# Patient Record
Sex: Female | Born: 2000 | Race: Black or African American | Hispanic: No | Marital: Single | Smoking: Never smoker
Health system: Southern US, Community
[De-identification: ages and names within clinical notes are randomized; demographics above are authoritative.]

## PROBLEM LIST (undated history)

## (undated) DIAGNOSIS — E282 Polycystic ovarian syndrome: Secondary | ICD-10-CM

## (undated) DIAGNOSIS — D1800 Hemangioma unspecified site: Secondary | ICD-10-CM

## (undated) DIAGNOSIS — G5600 Carpal tunnel syndrome, unspecified upper limb: Secondary | ICD-10-CM

## (undated) DIAGNOSIS — L83 Acanthosis nigricans: Secondary | ICD-10-CM

## (undated) DIAGNOSIS — Q170 Accessory auricle: Secondary | ICD-10-CM

## (undated) DIAGNOSIS — N390 Urinary tract infection, site not specified: Secondary | ICD-10-CM

## (undated) HISTORY — PX: MYRINGOTOMY: SUR874

## (undated) HISTORY — DX: Carpal tunnel syndrome, unspecified upper limb: G56.00

## (undated) HISTORY — DX: Urinary tract infection, site not specified: N39.0

## (undated) HISTORY — DX: Accessory auricle: Q17.0

## (undated) HISTORY — PX: WISDOM TOOTH EXTRACTION: SHX21

---
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
## 2018-01-03 DIAGNOSIS — M9903 Segmental and somatic dysfunction of lumbar region: Secondary | ICD-10-CM | POA: Diagnosis not present

## 2018-01-03 DIAGNOSIS — M9905 Segmental and somatic dysfunction of pelvic region: Secondary | ICD-10-CM | POA: Diagnosis not present

## 2018-01-03 DIAGNOSIS — M5386 Other specified dorsopathies, lumbar region: Secondary | ICD-10-CM | POA: Diagnosis not present

## 2018-01-17 DIAGNOSIS — M9905 Segmental and somatic dysfunction of pelvic region: Secondary | ICD-10-CM | POA: Diagnosis not present

## 2018-01-17 DIAGNOSIS — M9903 Segmental and somatic dysfunction of lumbar region: Secondary | ICD-10-CM | POA: Diagnosis not present

## 2018-01-17 DIAGNOSIS — M5386 Other specified dorsopathies, lumbar region: Secondary | ICD-10-CM | POA: Diagnosis not present

## 2018-02-07 DIAGNOSIS — L83 Acanthosis nigricans: Secondary | ICD-10-CM | POA: Diagnosis not present

## 2018-02-07 DIAGNOSIS — L700 Acne vulgaris: Secondary | ICD-10-CM | POA: Diagnosis not present

## 2018-02-14 DIAGNOSIS — M9903 Segmental and somatic dysfunction of lumbar region: Secondary | ICD-10-CM | POA: Diagnosis not present

## 2018-02-14 DIAGNOSIS — M9905 Segmental and somatic dysfunction of pelvic region: Secondary | ICD-10-CM | POA: Diagnosis not present

## 2018-02-14 DIAGNOSIS — M5386 Other specified dorsopathies, lumbar region: Secondary | ICD-10-CM | POA: Diagnosis not present

## 2018-02-23 DIAGNOSIS — S93504A Unspecified sprain of right lesser toe(s), initial encounter: Secondary | ICD-10-CM | POA: Diagnosis not present

## 2018-02-23 DIAGNOSIS — M71379 Other bursal cyst, unspecified ankle and foot: Secondary | ICD-10-CM | POA: Diagnosis not present

## 2018-02-26 DIAGNOSIS — R5383 Other fatigue: Secondary | ICD-10-CM | POA: Diagnosis not present

## 2018-03-09 DIAGNOSIS — H938X1 Other specified disorders of right ear: Secondary | ICD-10-CM | POA: Diagnosis not present

## 2018-03-09 DIAGNOSIS — H6122 Impacted cerumen, left ear: Secondary | ICD-10-CM | POA: Diagnosis not present

## 2018-03-19 DIAGNOSIS — M5386 Other specified dorsopathies, lumbar region: Secondary | ICD-10-CM | POA: Diagnosis not present

## 2018-03-19 DIAGNOSIS — M9903 Segmental and somatic dysfunction of lumbar region: Secondary | ICD-10-CM | POA: Diagnosis not present

## 2018-03-19 DIAGNOSIS — M9905 Segmental and somatic dysfunction of pelvic region: Secondary | ICD-10-CM | POA: Diagnosis not present

## 2018-04-09 DIAGNOSIS — L700 Acne vulgaris: Secondary | ICD-10-CM | POA: Diagnosis not present

## 2018-04-30 DIAGNOSIS — M9905 Segmental and somatic dysfunction of pelvic region: Secondary | ICD-10-CM | POA: Diagnosis not present

## 2018-04-30 DIAGNOSIS — M5386 Other specified dorsopathies, lumbar region: Secondary | ICD-10-CM | POA: Diagnosis not present

## 2018-04-30 DIAGNOSIS — M9903 Segmental and somatic dysfunction of lumbar region: Secondary | ICD-10-CM | POA: Diagnosis not present

## 2018-05-28 DIAGNOSIS — H6122 Impacted cerumen, left ear: Secondary | ICD-10-CM | POA: Diagnosis not present

## 2018-06-06 DIAGNOSIS — J069 Acute upper respiratory infection, unspecified: Secondary | ICD-10-CM | POA: Diagnosis not present

## 2018-06-21 DIAGNOSIS — N6320 Unspecified lump in the left breast, unspecified quadrant: Secondary | ICD-10-CM | POA: Diagnosis not present

## 2018-06-25 ENCOUNTER — Other Ambulatory Visit: Payer: Self-pay | Admitting: Obstetrics and Gynecology

## 2018-06-25 DIAGNOSIS — N6320 Unspecified lump in the left breast, unspecified quadrant: Secondary | ICD-10-CM

## 2018-07-09 DIAGNOSIS — L700 Acne vulgaris: Secondary | ICD-10-CM | POA: Diagnosis not present

## 2018-07-09 DIAGNOSIS — L83 Acanthosis nigricans: Secondary | ICD-10-CM | POA: Diagnosis not present

## 2018-07-10 ENCOUNTER — Ambulatory Visit
Admission: RE | Admit: 2018-07-10 | Discharge: 2018-07-10 | Disposition: A | Discharge status: Home / Self Care | Payer: 59 | Source: Ambulatory Visit | Attending: Obstetrics and Gynecology | Admitting: Obstetrics and Gynecology

## 2018-07-10 DIAGNOSIS — N6489 Other specified disorders of breast: Secondary | ICD-10-CM | POA: Diagnosis not present

## 2018-07-10 DIAGNOSIS — N6320 Unspecified lump in the left breast, unspecified quadrant: Secondary | ICD-10-CM

## 2018-08-15 DIAGNOSIS — M9901 Segmental and somatic dysfunction of cervical region: Secondary | ICD-10-CM | POA: Diagnosis not present

## 2018-08-15 DIAGNOSIS — M531 Cervicobrachial syndrome: Secondary | ICD-10-CM | POA: Diagnosis not present

## 2018-08-15 DIAGNOSIS — M9902 Segmental and somatic dysfunction of thoracic region: Secondary | ICD-10-CM | POA: Diagnosis not present

## 2018-08-20 DIAGNOSIS — M9902 Segmental and somatic dysfunction of thoracic region: Secondary | ICD-10-CM | POA: Diagnosis not present

## 2018-08-20 DIAGNOSIS — M9901 Segmental and somatic dysfunction of cervical region: Secondary | ICD-10-CM | POA: Diagnosis not present

## 2018-08-20 DIAGNOSIS — M531 Cervicobrachial syndrome: Secondary | ICD-10-CM | POA: Diagnosis not present

## 2018-08-29 DIAGNOSIS — M9901 Segmental and somatic dysfunction of cervical region: Secondary | ICD-10-CM | POA: Diagnosis not present

## 2018-08-29 DIAGNOSIS — M531 Cervicobrachial syndrome: Secondary | ICD-10-CM | POA: Diagnosis not present

## 2018-08-29 DIAGNOSIS — M9902 Segmental and somatic dysfunction of thoracic region: Secondary | ICD-10-CM | POA: Diagnosis not present

## 2018-09-12 DIAGNOSIS — M180 Bilateral primary osteoarthritis of first carpometacarpal joints: Secondary | ICD-10-CM | POA: Diagnosis not present

## 2018-09-12 DIAGNOSIS — L700 Acne vulgaris: Secondary | ICD-10-CM | POA: Diagnosis not present

## 2018-09-12 DIAGNOSIS — L110 Acquired keratosis follicularis: Secondary | ICD-10-CM | POA: Diagnosis not present

## 2018-10-24 DIAGNOSIS — R5383 Other fatigue: Secondary | ICD-10-CM | POA: Diagnosis not present

## 2018-11-05 ENCOUNTER — Encounter (HOSPITAL_BASED_OUTPATIENT_CLINIC_OR_DEPARTMENT_OTHER): Payer: Self-pay

## 2018-11-05 ENCOUNTER — Other Ambulatory Visit: Payer: Self-pay

## 2018-11-05 ENCOUNTER — Emergency Department (HOSPITAL_BASED_OUTPATIENT_CLINIC_OR_DEPARTMENT_OTHER)
Admission: EM | Admit: 2018-11-05 | Discharge: 2018-11-05 | Disposition: A | Discharge status: Home / Self Care | Payer: 59 | Attending: Emergency Medicine | Admitting: Emergency Medicine

## 2018-11-05 ENCOUNTER — Emergency Department (HOSPITAL_BASED_OUTPATIENT_CLINIC_OR_DEPARTMENT_OTHER): Payer: 59

## 2018-11-05 DIAGNOSIS — R202 Paresthesia of skin: Secondary | ICD-10-CM | POA: Insufficient documentation

## 2018-11-05 DIAGNOSIS — R200 Anesthesia of skin: Secondary | ICD-10-CM

## 2018-11-05 DIAGNOSIS — R29810 Facial weakness: Secondary | ICD-10-CM | POA: Diagnosis not present

## 2018-11-05 DIAGNOSIS — R531 Weakness: Secondary | ICD-10-CM | POA: Diagnosis not present

## 2018-11-05 HISTORY — DX: Acanthosis nigricans: L83

## 2018-11-05 HISTORY — DX: Polycystic ovarian syndrome: E28.2

## 2018-11-05 LAB — CBC WITH DIFFERENTIAL/PLATELET
Abs Immature Granulocytes: 0.01 10*3/uL (ref 0.00–0.07)
Basophils Absolute: 0 10*3/uL (ref 0.0–0.1)
Basophils Relative: 1 %
Eosinophils Absolute: 0.2 10*3/uL (ref 0.0–0.5)
Eosinophils Relative: 4 %
HCT: 39.7 % (ref 36.0–46.0)
Hemoglobin: 13.1 g/dL (ref 12.0–15.0)
Immature Granulocytes: 0 %
Lymphocytes Relative: 42 %
Lymphs Abs: 2.6 10*3/uL (ref 0.7–4.0)
MCH: 30.3 pg (ref 26.0–34.0)
MCHC: 33 g/dL (ref 30.0–36.0)
MCV: 91.9 fL (ref 80.0–100.0)
Monocytes Absolute: 0.5 10*3/uL (ref 0.1–1.0)
Monocytes Relative: 7 %
Neutro Abs: 3 10*3/uL (ref 1.7–7.7)
Neutrophils Relative %: 46 %
Platelets: 285 10*3/uL (ref 150–400)
RBC: 4.32 MIL/uL (ref 3.87–5.11)
RDW: 12.8 % (ref 11.5–15.5)
WBC: 6.4 10*3/uL (ref 4.0–10.5)
nRBC: 0 % (ref 0.0–0.2)

## 2018-11-05 LAB — BASIC METABOLIC PANEL
Anion gap: 6 (ref 5–15)
BUN: 12 mg/dL (ref 6–20)
CO2: 25 mmol/L (ref 22–32)
Calcium: 9.2 mg/dL (ref 8.9–10.3)
Chloride: 106 mmol/L (ref 98–111)
Creatinine, Ser: 0.8 mg/dL (ref 0.44–1.00)
GFR calc Af Amer: >60 mL/min (ref >60)
GFR calc non Af Amer: >60 mL/min (ref >60)
Glucose, Bld: 79 mg/dL (ref 70–99)
Potassium: 3.8 mmol/L (ref 3.5–5.1)
Sodium: 137 mmol/L (ref 135–145)

## 2018-11-05 NOTE — Discharge Instructions (Addendum)
You have been seen today for facial pain, numbness and arm pain. please read and follow all provided instructions. Return to the emergency room for worsening condition or new concerning symptoms.    Your work-up in the emergency department today was overall negative.  Your head CT is normal, her EKG and blood work was also normal.  1. Medications:  Prescription you can take Tylenol or ibuprofen for pain.  Please take as directed on the bottle. Continue usual home medications Take medications as prescribed. Please review all of the medicines and only take them if you do not have an allergy to them.  2. Treatment: rest, drink plenty of fluids 3. Follow Up: Please follow up with your primary doctor in 2-5 days for discussion of your diagnoses and further evaluation after today's visit; Call today to arrange your follow up.  Possible your primary care doctor will order an outpatient MRI or have you follow-up with a neurologist.  It is also a possibility that you have an allergic reaction to any of the medicines that you have been prescribed - Everybody reacts differently to medications and while MOST people have no trouble with most medicines, you may have a reaction such as nausea, vomiting, rash, swelling, shortness of breath. If this is the case, please stop taking the medicine immediately and contact your physician.  ?

## 2018-11-05 NOTE — ED Provider Notes (Signed)
[place] EMERGENCY DEPARTMENT Provider Note   CSN: [id number] Arrival date & time: 11/05/18  1557    History   Chief Complaint Chief Complaint  Patient presents with  . Facial Pain    HPI Michelle Hancock is a 18 y.o. female with history of PCOS presenting to emergency department today with chief complaint of left-sided facial numbness x 1 week.  This has been intermittent.  She also states intermittent sharp shooting pain also in the left side of her face. The pain lasts 2-3 minutes and resolves spontaneously. Pt admits to left arm numbness x 3 days. She reports this is intermittent. She describes the numbness as she can feel her arm, but it does not feel like it normally does. She denies any associated injury.   She has not taken anything for the pain prior to arrival. She denies history of similar symptoms. Also denies visual changes, headache, seizure, syncope, dizziness.    Past Medical History:  Diagnosis Date  . Confluent and reticulated papillomatosis (CARP)   . PCOS (polycystic ovarian syndrome)   . Preauricular tag   . UTI (lower urinary tract infection)     Patient Active Problem List   Diagnosis Date Noted  . Patent pressure equalization (PE) tubes, bilateral 04/11/2016  . Irregular menstrual cycle 07/11/2014  . Abnormal menstrual periods 07/11/2014  . BMI, pediatric > 99% for age 07/27/2012  . Headache(784.0) 04/26/2013  . Well adolescent visit 04/26/2013  . Persistent cough 02/26/2011  . POSTURAL LIGHTHEADEDNESS 11/20/2007  . COUGH 05/25/2007    Past Surgical History:  Procedure Laterality Date  . MYRINGOTOMY     tonsils     OB History   No obstetric history on file.      Home Medications    Prior to Admission medications   Not on File    Family History Family History  Problem Relation Age of Onset  . Allergies Father   . Gestational diabetes Mother   . Hypertension Mother   . Hyperlipidemia Mother   . Breast cancer Maternal  Aunt     Social History Social History   Tobacco Use  . Smoking status: Never Smoker  . Smokeless tobacco: Never Used  Substance Use Topics  . Alcohol use: No  . Drug use: No     Allergies   Tacrolimus   Review of Systems Review of Systems  Constitutional: Negative for chills and fever.  HENT: Negative for congestion, ear discharge, ear pain, sinus pressure, sinus pain and sore throat.   Eyes: Negative for photophobia, pain, redness and visual disturbance.  Respiratory: Negative for cough, chest tightness and shortness of breath.   Cardiovascular: Negative for chest pain.  Gastrointestinal: Negative for abdominal pain, constipation, diarrhea, nausea and vomiting.  Genitourinary: Negative for dysuria and hematuria.  Musculoskeletal: Negative for back pain and neck pain.  Skin: Negative for wound.  Neurological: Positive for weakness and numbness. Negative for dizziness, syncope, facial asymmetry, speech difficulty and headaches.     Physical Exam Updated Vital Signs BP 119/77 (BP Location: Left Arm)   Pulse 85   Temp 98.3 F (36.8 C) (Oral)   Resp 16   Ht 5\' 7"  (1.702 m)   Wt 102.1 kg   LMP 11/04/2018   SpO2 99%   BMI 35.24 kg/m   Physical Exam Vitals signs and nursing note reviewed.  Constitutional:      Appearance: She is not ill-appearing or toxic-appearing.  HENT:     Head: Normocephalic and atraumatic.  Comments: No sinus or temporal tenderness.    Right Ear: Tympanic membrane, ear canal and external ear normal. There is no impacted cerumen.     Left Ear: Tympanic membrane, ear canal and external ear normal. There is no impacted cerumen.  Eyes:     General: No scleral icterus.    Conjunctiva/sclera: Conjunctivae normal.     Pupils: Pupils are equal, round, and reactive to light.  Neck:     Musculoskeletal: Normal range of motion.  Cardiovascular:     Rate and Rhythm: Normal rate and regular rhythm.     Pulses: Normal pulses.          Radial  pulses are 2+ on the right side and 2+ on the left side.     Heart sounds: Normal heart sounds.  Pulmonary:     Effort: Pulmonary effort is normal.     Breath sounds: Normal breath sounds.  Abdominal:     General: There is no distension.     Palpations: Abdomen is soft.     Tenderness: There is no abdominal tenderness.  Musculoskeletal: Normal range of motion.  Skin:    General: Skin is warm and dry.  Neurological:     Mental Status: She is alert and oriented to person, place, and time.     Comments: Mental Status:  Alert, oriented, thought content appropriate, able to give a coherent history. Speech fluent without evidence of aphasia. Able to follow 2 step commands without difficulty.  Cranial Nerves:  II:  Peripheral visual fields grossly normal, pupils equal, round, reactive to light III,IV, VI: ptosis not present, extra-ocular motions intact bilaterally  V,VII: smile symmetric, facial light touch sensation equal VIII: hearing grossly normal to voice  X: uvula elevates symmetrically  XI: bilateral shoulder shrug symmetric and strong XII: midline tongue extension without fassiculations Motor:  Normal tone. 5/5 in upper and lower extremities bilaterally including strong and equal grip strength and dorsiflexion/plantar flexion Sensory: Pinprick and light touch normal in all extremities.  Deep Tendon Reflexes: 2+ and symmetric in the biceps and patella Cerebellar: normal finger-to-nose with bilateral upper extremities Gait: normal gait and balance CV: distal pulses palpable throughout   Psychiatric:        Behavior: Behavior normal.      ED Treatments / Results  Labs (all labs ordered are listed, but only abnormal results are displayed) Labs Reviewed  CBC WITH DIFFERENTIAL/PLATELET  BASIC METABOLIC PANEL    EKG None  Radiology Ct Head Wo Contrast  Result Date: 11/05/2018 CLINICAL DATA:  Left side facial numbness, tingling. Left arm numbness. EXAM: CT HEAD WITHOUT  CONTRAST TECHNIQUE: Contiguous axial images were obtained from the base of the skull through the vertex without intravenous contrast. COMPARISON:  None. FINDINGS: Brain: No acute intracranial abnormality. Specifically, no hemorrhage, hydrocephalus, mass lesion, acute infarction, or significant intracranial injury. Vascular: No hyperdense vessel or unexpected calcification. Skull: No acute calvarial abnormality. Sinuses/Orbits: Visualized paranasal sinuses and mastoids clear. Orbital soft tissues unremarkable. Other: None IMPRESSION: Normal study. Electronically Signed   By: Rolm Baptise M.D.   On: 11/05/2018 20:46    Procedures Procedures (including critical care time)  Medications Ordered in ED Medications - No data to display   Initial Impression / Assessment and Plan / ED Course  I have reviewed the triage vital signs and the nursing notes.  Pertinent labs & imaging results that were available during my care of the patient were reviewed by me and considered in my medical decision making (see  chart for details).  18 yo female presents with left sided facial numbness and pain, and left arm numbness. She denies associated headache. She is afebrile, overall well appearing. Her neuro exam is without focal deficit. Work up initiated with CBC, BMP, EKG, Head CT. DDx includes trigeminal neuralgia, bells palsy, stroke, TIA, MS.  Pt does not have risk factors for stroke, however given the numbness and arm weakness will obtain imaging. Discussed with attending Dr. Billy Fischer and we do not feel emergent MRI is needed in the ED today given normal neuro exam.  EKG viewed by me is normal sinus rhythm, no signs of ischemia, no irregular rhythm. Labs are unremarkable. No metabolic derangements, no leukocytosis, no renal insufficiency. Head CT viewed by me is negative for sign of ischemic or hemorrhagic stroke. Serial neuro exams remain without focal deficit. Patient is hemodynamically stable, in NAD, and able to  ambulate in the ED. Evaluation does not show pathology that would require ongoing emergent intervention or inpatient treatment. I explained the diagnosis to the patient.  Patient is comfortable with above plan and is stable for discharge at this time. All questions were answered prior to disposition. Strict return precautions for returning to the ED were discussed. Encouraged follow up with PCP. Pt case discussed with Dr. Billy Fischer who agrees with my plan.    This note was prepared with assistance of Systems analyst. Occasional wrong-word or sound-a-like substitutions may have occurred due to the inherent limitations of voice recognition software.  Final Clinical Impressions(s) / ED Diagnoses   Final diagnoses:  Left facial numbness    ED Discharge Orders    None       Cherre Robins, PA-C 11/06/18 1338    Gareth Morgan, MD 11/06/18 [phone]

## 2018-11-05 NOTE — ED Triage Notes (Addendum)
L side face numbness and pain x 1 week. No having L arm numbness x 3 days. Pt denies any neuro symptoms in lower extremities. No facial drooping or extremity weakness noted in triage.

## 2018-11-09 DIAGNOSIS — R200 Anesthesia of skin: Secondary | ICD-10-CM | POA: Diagnosis not present

## 2018-11-09 DIAGNOSIS — R202 Paresthesia of skin: Secondary | ICD-10-CM | POA: Diagnosis not present

## 2018-11-11 ENCOUNTER — Other Ambulatory Visit: Payer: Self-pay

## 2018-11-11 ENCOUNTER — Emergency Department (HOSPITAL_COMMUNITY): Payer: 59

## 2018-11-11 ENCOUNTER — Emergency Department (HOSPITAL_BASED_OUTPATIENT_CLINIC_OR_DEPARTMENT_OTHER)
Admission: EM | Admit: 2018-11-11 | Discharge: 2018-11-12 | Disposition: A | Discharge status: Home / Self Care | Payer: 59 | Attending: Emergency Medicine | Admitting: Emergency Medicine

## 2018-11-11 ENCOUNTER — Encounter (HOSPITAL_BASED_OUTPATIENT_CLINIC_OR_DEPARTMENT_OTHER): Payer: Self-pay

## 2018-11-11 DIAGNOSIS — R9389 Abnormal findings on diagnostic imaging of other specified body structures: Secondary | ICD-10-CM

## 2018-11-11 DIAGNOSIS — R200 Anesthesia of skin: Secondary | ICD-10-CM | POA: Diagnosis not present

## 2018-11-11 DIAGNOSIS — R202 Paresthesia of skin: Secondary | ICD-10-CM | POA: Diagnosis not present

## 2018-11-11 LAB — URINALYSIS, ROUTINE W REFLEX MICROSCOPIC
Bilirubin Urine: NEGATIVE
Glucose, UA: NEGATIVE mg/dL
Hgb urine dipstick: NEGATIVE
Ketones, ur: NEGATIVE mg/dL
Leukocytes,Ua: NEGATIVE
Nitrite: NEGATIVE
Protein, ur: NEGATIVE mg/dL
Specific Gravity, Urine: 1.02 (ref 1.005–1.030)
pH: 7 (ref 5.0–8.0)

## 2018-11-11 LAB — CBC WITH DIFFERENTIAL/PLATELET
Abs Immature Granulocytes: 0.01 10*3/uL (ref 0.00–0.07)
Basophils Absolute: 0 10*3/uL (ref 0.0–0.1)
Basophils Relative: 1 %
Eosinophils Absolute: 0.3 10*3/uL (ref 0.0–0.5)
Eosinophils Relative: 4 %
HCT: 42 % (ref 36.0–46.0)
Hemoglobin: 14 g/dL (ref 12.0–15.0)
Immature Granulocytes: 0 %
Lymphocytes Relative: 30 %
Lymphs Abs: 1.9 10*3/uL (ref 0.7–4.0)
MCH: 30.7 pg (ref 26.0–34.0)
MCHC: 33.3 g/dL (ref 30.0–36.0)
MCV: 92.1 fL (ref 80.0–100.0)
Monocytes Absolute: 0.5 10*3/uL (ref 0.1–1.0)
Monocytes Relative: 8 %
Neutro Abs: 3.7 10*3/uL (ref 1.7–7.7)
Neutrophils Relative %: 57 %
Platelets: 328 10*3/uL (ref 150–400)
RBC: 4.56 MIL/uL (ref 3.87–5.11)
RDW: 12.7 % (ref 11.5–15.5)
WBC: 6.4 10*3/uL (ref 4.0–10.5)
nRBC: 0 % (ref 0.0–0.2)

## 2018-11-11 LAB — RAPID URINE DRUG SCREEN, HOSP PERFORMED
Amphetamines: NOT DETECTED
Barbiturates: NOT DETECTED
Benzodiazepines: NOT DETECTED
Cocaine: NOT DETECTED
Opiates: NOT DETECTED
Tetrahydrocannabinol: NOT DETECTED

## 2018-11-11 LAB — BASIC METABOLIC PANEL
Anion gap: 7 (ref 5–15)
BUN: 12 mg/dL (ref 6–20)
CO2: 27 mmol/L (ref 22–32)
Calcium: 9.5 mg/dL (ref 8.9–10.3)
Chloride: 106 mmol/L (ref 98–111)
Creatinine, Ser: 0.84 mg/dL (ref 0.44–1.00)
GFR calc Af Amer: >60 mL/min (ref >60)
GFR calc non Af Amer: >60 mL/min (ref >60)
Glucose, Bld: 90 mg/dL (ref 70–99)
Potassium: 4.4 mmol/L (ref 3.5–5.1)
Sodium: 140 mmol/L (ref 135–145)

## 2018-11-11 LAB — PREGNANCY, URINE: Preg Test, Ur: NEGATIVE

## 2018-11-11 MED ORDER — GADOBUTROL 1 MMOL/ML IV SOLN
10.0000 mL | Freq: Once | INTRAVENOUS | Status: AC | PRN
  Administered 2018-11-11: 10 mL via INTRAVENOUS

## 2018-11-11 NOTE — ED Notes (Signed)
Pt ambulatory to restroom

## 2018-11-11 NOTE — ED Notes (Signed)
Patient transported to MRI 

## 2018-11-11 NOTE — ED Provider Notes (Signed)
Care assumed from oncoming provider, PA Petrucelli.  As discussed, plan agreed upon.  Will follow up on pending MRI and dispo appropriately.  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  No edema, hemorrhage or other acute findings.  Patient evaluated by me.  She is still complaining of persistent numbness to the left side of her face, upper and lower extremity.  She does feel as if she is having weakness to her right upper and lower extremity as well.  Sensation is equal and intact, but she does have 4/5 muscle strength to both left upper and lower extremities compared to the right.   Given exam findings and MRI, neurology, Dr. Rory Percy, was consulted. Recommends outpatient neurology follow up for EEG and further neurologic care. Referral placed. Plan of care / return precautions discussed with patient. All questions answered.    Ward, Ozella Almond, PA-C 11/12/18 0012    Tegeler, Gwenyth Allegra, MD 11/12/18 [phone]

## 2018-11-11 NOTE — ED Notes (Signed)
ED Provider at bedside. 

## 2018-11-11 NOTE — ED Notes (Signed)
During the [facility] pt noted to have 1 point L leg weakness, but symptoms are not consistent when test is repeated.

## 2018-11-11 NOTE — ED Notes (Signed)
Pt c/o HA and L side facial tingling that started, then pt became ShOB with CP and feeling flushed, and tingling in L arm and leg. Pt was seen here for same on 5/4. Pt states symptoms returned yesterday and are worse this time. No facial droop or unilateral weakness noted during triage.

## 2018-11-11 NOTE — ED Provider Notes (Signed)
[place] EMERGENCY DEPARTMENT Provider Note   CSN: [id number] Arrival date & time: 11/11/18  1706    History   Chief Complaint Chief Complaint  Patient presents with  . Numbness    HPI Michelle Hancock is a 18 y.o. female.     Patient is a 18 year old female with history of polycystic ovaries, prior UTI, and irregular menstrual cycles.  She presents today for evaluation of numbness to the left side of her face, left arm, and left leg.  This is been occurring intermittently for the past several days.  She was seen here this past Monday with a similar episode.  She had a head CT and laboratory studies obtained which were unremarkable and she was discharged home.  Those have been waxing and waning since, but worsened this afternoon while she was at work.  She describes tingling to her face and left arm.  She also describes feeling weak in her left leg.  She denies any headache or visual disturbances.  She denies any fevers or chills.  The history is provided by the patient.    Past Medical History:  Diagnosis Date  . Confluent and reticulated papillomatosis (CARP)   . PCOS (polycystic ovarian syndrome)   . Preauricular tag   . UTI (lower urinary tract infection)     Patient Active Problem List   Diagnosis Date Noted  . Patent pressure equalization (PE) tubes, bilateral 04/11/2016  . Irregular menstrual cycle 07/11/2014  . Abnormal menstrual periods 07/11/2014  . BMI, pediatric > 99% for age 102/24/2014  . Headache(784.0) 04/26/2013  . Well adolescent visit 04/26/2013  . Persistent cough 02/26/2011  . POSTURAL LIGHTHEADEDNESS 11/20/2007  . COUGH 05/25/2007    Past Surgical History:  Procedure Laterality Date  . MYRINGOTOMY     tonsils     OB History   No obstetric history on file.      Home Medications    Prior to Admission medications   Not on File    Family History Family History  Problem Relation Age of Onset  . Allergies Father   .  Gestational diabetes Mother   . Hypertension Mother   . Hyperlipidemia Mother   . Breast cancer Maternal Aunt     Social History Social History   Tobacco Use  . Smoking status: Never Smoker  . Smokeless tobacco: Never Used  Substance Use Topics  . Alcohol use: No  . Drug use: No     Allergies   Tacrolimus   Review of Systems Review of Systems  All other systems reviewed and are negative.    Physical Exam Updated Vital Signs BP 121/81   Pulse 84   Temp 98.4 F (36.9 C) (Oral)   Resp 17   Ht 5\' 7"  (1.702 m)   Wt 102.1 kg   LMP 11/04/2018   SpO2 100%   BMI 35.24 kg/m   Physical Exam Vitals signs and nursing note reviewed.  Constitutional:      General: She is not in acute distress.    Appearance: She is well-developed. She is not diaphoretic.  HENT:     Head: Normocephalic and atraumatic.  Neck:     Musculoskeletal: Normal range of motion and neck supple.  Cardiovascular:     Rate and Rhythm: Normal rate and regular rhythm.     Heart sounds: No murmur. No friction rub. No gallop.   Pulmonary:     Effort: Pulmonary effort is normal. No respiratory distress.  Breath sounds: Normal breath sounds. No wheezing.  Abdominal:     General: Bowel sounds are normal. There is no distension.     Palpations: Abdomen is soft.     Tenderness: There is no abdominal tenderness.  Musculoskeletal: Normal range of motion.  Skin:    General: Skin is warm and dry.  Neurological:     Mental Status: She is alert and oriented to person, place, and time.     Cranial Nerves: No cranial nerve deficit.     Sensory: No sensory deficit.     Motor: Weakness present.     Coordination: Coordination normal.     Comments: Strength is 5 out of 5 in the right upper and right lower extremities and 4+ out of 5 in the left upper and left lower extremity.  There is no facial droop noted.      ED Treatments / Results  Labs (all labs ordered are listed, but only abnormal results are  displayed) Labs Reviewed  BASIC METABOLIC PANEL  CBC WITH DIFFERENTIAL/PLATELET  URINALYSIS, ROUTINE W REFLEX MICROSCOPIC  PREGNANCY, URINE  RAPID URINE DRUG SCREEN, HOSP PERFORMED    EKG EKG Interpretation  Date/Time:  Sunday Nov 11 2018 17:18:41 EDT Ventricular Rate:  80 PR Interval:    QRS Duration: 74 QT Interval:  356 QTC Calculation: 411 R Axis:   31 Text Interpretation:  Sinus rhythm Baseline wander in lead(s) V6 Normal ECG Confirmed by Veryl Speak [phone]) on 11/11/2018 5:20:53 PM   Radiology No results found.  Procedures Procedures (including critical care time)  Medications Ordered in ED Medications - No data to display   Initial Impression / Assessment and Plan / ED Course  I have reviewed the triage vital signs and the nursing notes.  Pertinent labs & imaging results that were available during my care of the patient were reviewed by me and considered in my medical decision making (see chart for details).  Patient presenting here for the second time this week complaining of numbness to the left side of her body.  She describes numbness to her left face, left arm, and left leg.  She has slightly decreased grip strength and leg strength in the left arm and left leg.  Cranial nerves are intact.  I am uncertain as to what is causing this patient's symptoms, however the possibility of stroke/TIA versus MS should probably be addressed.  I have discussed the care with Dr. Sedonia Small at [facility] who agrees to accept in transfer.  Patient will be going for a noncontrast MRI of her brain to rule out the above possibilities.  If the studies are negative, I anticipate discharge.  Final Clinical Impressions(s) / ED Diagnoses   Final diagnoses:  None    ED Discharge Orders    None       Veryl Speak, MD 11/11/18 1842

## 2018-11-11 NOTE — ED Provider Notes (Signed)
20:15: Patient arrived to [facility] ER as transfer from [facility] for MRI.  Please see prior provider note for full H&P.  Briefly here w/ L facial, LUE, & LLE numbness.  Sent to evaluate for stroke/TIA vs. MS.  Resting comfortably on arrival.  MRI was ordered prior to transfer and is wo contrast, given concern for possible MS will change to with and without contrast.   Patient signed out to [facility]- PA-C pending MRI results & disposition.    Leafy Kindle 11/11/18 2121    Tegeler, Gwenyth Allegra, MD 11/12/18 [phone]

## 2018-11-12 NOTE — Discharge Instructions (Addendum)
It was my pleasure taking care of you today!   As we discussed, I would like you to follow up with neurology. Our neurologist that I spoke with today recommended that you get an EEG (further testing on your brain) within 2 weeks if possible. I have sent a referral to [facility]. Give them a call in the morning to schedule a follow up appointment.   Return to ER for new or worsening symptoms, any additional concerns.

## 2018-11-13 DIAGNOSIS — R202 Paresthesia of skin: Secondary | ICD-10-CM | POA: Diagnosis not present

## 2018-11-13 DIAGNOSIS — R5383 Other fatigue: Secondary | ICD-10-CM | POA: Diagnosis not present

## 2018-11-15 ENCOUNTER — Ambulatory Visit: Payer: 59 | Admitting: Neurology

## 2018-11-15 ENCOUNTER — Encounter: Payer: Self-pay | Admitting: Neurology

## 2018-11-15 ENCOUNTER — Other Ambulatory Visit: Payer: Self-pay

## 2018-11-15 VITALS — BP 115/70 | HR 87 | Temp 99.3°F | Ht 67.0 in | Wt 231.0 lb

## 2018-11-15 DIAGNOSIS — R208 Other disturbances of skin sensation: Secondary | ICD-10-CM | POA: Diagnosis not present

## 2018-11-15 DIAGNOSIS — D1800 Hemangioma unspecified site: Secondary | ICD-10-CM | POA: Diagnosis not present

## 2018-11-15 DIAGNOSIS — R404 Transient alteration of awareness: Secondary | ICD-10-CM | POA: Diagnosis not present

## 2018-11-15 DIAGNOSIS — R200 Anesthesia of skin: Secondary | ICD-10-CM | POA: Insufficient documentation

## 2018-11-15 NOTE — Progress Notes (Signed)
[facility]  PATIENT: Michelle Hancock DOB: 06-Jun-2001  REFERRING DOCTOR OR PCP:  York Cerise Ward SOURCE: patient, ED notes (11/05/18 and 11/11/2018), imaging and lab reports, MRI images personally reviewed,     _________________________________   HISTORICAL  CHIEF COMPLAINT:  Chief Complaint  Patient presents with   New Patient (Initial Visit)    RM 12, alone. Internal/paper referral from [facility] for left sided numbness (face and arm). Was also in leg but this has improved. Sx started three week ago (face). Left arm numbness started about a week ago.    HISTORY OF PRESENT ILLNESS:  At the pleasure seeing your patient, Michelle Hancock, at [facility] for neurologic consultation regarding her numbness.  She is a 18 year old woman who presented to the emergency room 11/05/2018 with intermittent left-sided facial numbness for the previous week. V1, V2 and V3 was involved.   She feels tingling that will intensify with pain.  This leads to pain lasting a couple minutes that would then resolve.  She also reported intermittent left arm numbness for several days.  She had mild headache.  A CT scan was performed and was normal.  Lab work and EKG were normal.  She returned on 11/11/2018 with similar intermittent numbness of the left face, arm and leg.  She also was reporting weakness in the left arm and leg.  She describes the left limb symptoms as being more like the limb falling asleep --- less painful than the face,.  Symptoms were still coming and going over a few minutes.  She had no right sided symptoms.   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  There is no evidence of MS.  The onset of an individual episode of numbness/dysesthesia seemed to occur with activity like talking or chewing in the face but more random in the left arm and leg.   In between  episodes, she felt completely baseline.    No jerking or change in awareness. She did not note any definite cognitive change during the episodes.  However, she felt like she was dragging for a day after the first few episodes.    Currently, she notes less numbness in the arm and no numbness in the leg.   The face though is the same to a little worse as the ear is now involved.      She notes no change in gait or balance but felt a little off when she had leg weakness.   Bladder function is unchanged.    She denies change in vision.      No Lhermitte signs.    No neurologic episodes in the past.    There is no FH of MS.   She works in Scientist, research (medical)  I personally reviewed records from her 2 emergency room visits and images from the CT scan of the head dated 11/05/2018 and the MRI of the brain dated 11/11/2018.  Both show a cavum septum pellucidum et vergae, a common ventricular variant.  Additionally, the MRI shows a small focus of magnetic susceptibility in the right posterior frontal white matter adjacent to a venous anomaly likely representing a small cavernous angioma.   REVIEW OF SYSTEMS: Constitutional: No fevers, chills, sweats, or change in appetite Eyes: No visual changes, double vision, eye pain Ear, nose and throat: No hearing loss, ear pain, nasal congestion, sore throat Cardiovascular: No chest pain,  palpitations Respiratory: No shortness of breath at rest or with exertion.   No wheezes GastrointestinaI: No nausea, vomiting, diarrhea, abdominal pain, fecal incontinence Genitourinary: No dysuria, urinary retention or frequency.  No nocturia. Musculoskeletal: No neck pain, back pain Integumentary: No rash, pruritus, skin lesions Neurological: as above Psychiatric: No depression at this time.  No anxiety Endocrine: No palpitations, diaphoresis, change in appetite, change in weigh or increased thirst Hematologic/Lymphatic: No anemia, purpura, petechiae. Allergic/Immunologic: No itchy/runny  eyes, nasal congestion, recent allergic reactions, rashes  ALLERGIES: Allergies  Allergen Reactions   Tacrolimus Hives    HOME MEDICATIONS:  Current Outpatient Medications:    Minocycline HCl 135 MG TB24, , Disp: , Rfl:    norethindrone (AYGESTIN) 5 MG tablet, , Disp: , Rfl:    spironolactone (ALDACTONE) 50 MG tablet, Take 50 mg by mouth daily., Disp: , Rfl:   PAST MEDICAL HISTORY: Past Medical History:  Diagnosis Date   Confluent and reticulated papillomatosis (CARP)    PCOS (polycystic ovarian syndrome)    Preauricular tag    UTI (lower urinary tract infection)     PAST SURGICAL HISTORY: Past Surgical History:  Procedure Laterality Date   MYRINGOTOMY     tonsils    FAMILY HISTORY: Family History  Problem Relation Age of Onset   Allergies Father    Gestational diabetes Mother    Hypertension Mother    Hyperlipidemia Mother    Breast cancer Maternal Aunt     SOCIAL HISTORY:  Social History   Socioeconomic History   Marital status: Single    Spouse name: Not on file   Number of children: 0   Years of education: Not on file   Highest education level: Not on file  Occupational History   Occupation: Hydrographic surveyor  Social Needs   Financial resource strain: Not on file   Food insecurity:    Worry: Not on file    Inability: Not on file   Transportation needs:    Medical: Not on file    Non-medical: Not on file  Tobacco Use   Smoking status: Never Smoker   Smokeless tobacco: Never Used  Substance and Sexual Activity   Alcohol use: No   Drug use: No   Sexual activity: Never  Lifestyle   Physical activity:    Days per week: Not on file    Minutes per session: Not on file   Stress: Not on file  Relationships   Social connections:    Talks on phone: Not on file    Gets together: Not on file    Attends religious service: Not on file    Active member of club or organization: Not on file    Attends meetings of clubs or  organizations: Not on file    Relationship status: Not on file   Intimate partner violence:    Fear of current or ex partner: Not on file    Emotionally abused: Not on file    Physically abused: Not on file    Forced sexual activity: Not on file  Other Topics Concern   Not on file  Social History Narrative   Lives w/    Caffeine use: none   Pets Dog outside and Cat   hho f 4   [employer]  9th grade    No ets           PHYSICAL EXAM  Vitals:   11/15/18 0842  BP: 115/70  Pulse: 87  Temp: 99.3 F (37.4 C)  Weight:  231 lb (104.8 kg)  Height: 5\' 7"  (1.702 m)    Body mass index is 36.18 kg/m.   General: The patient is well-developed and well-nourished and in no acute distress  Eyes:  Funduscopic exam shows normal optic discs and retinal vessels.  Neck: The neck is supple, no carotid bruits are noted.  The neck is nontender.  Cardiovascular: The heart has a regular rate and rhythm with a normal S1 and S2. There were no murmurs, gallops or rubs. Lungs are clear to auscultation.  Skin: Extremities are without significant edema.  Musculoskeletal:  Back is nontender  Neurologic Exam  Mental status: The patient is alert and oriented x 3 at the time of the examination. The patient has apparent normal recent and remote memory, with an apparently normal attention span and concentration ability.   Speech is normal.  Cranial nerves: Extraocular movements are full. Pupils are equal, round, and reactive to light and accomodation.  Visual fields are full.  Facial symmetry is present. There is good facial sensation to soft touch bilaterally.Facial strength is normal.  Trapezius and sternocleidomastoid strength is normal. No dysarthria is noted.  The tongue is midline, and the patient has symmetric elevation of the soft palate. No obvious hearing deficits are noted.  Motor:  Muscle bulk is normal.   Tone is normal. Strength is  5 / 5 in all 4 extremities except 4+/5 in the left APB  muscle of the hand (median innervated).   Sensory: She has Tinel's and Phalen signs in the left wrist.  Sensory testing is intact to pinprick, soft touch and vibration sensation in all 4 extremities.  Coordination: Cerebellar testing reveals good finger-nose-finger and heel-to-shin bilaterally.  Gait and station: Station is normal.   Gait is normal. Tandem gait is mildly wide.. Romberg is negative.   Reflexes: Deep tendon reflexes are symmetric and normal bilaterally.   Plantar responses are flexor.    DIAGNOSTIC DATA (LABS, IMAGING, TESTING) - I reviewed patient records, labs, notes, testing and imaging myself where available.  Lab Results  Component Value Date   WBC 6.4 11/11/2018   HGB 14.0 11/11/2018   HCT 42.0 11/11/2018   MCV 92.1 11/11/2018   PLT 328 11/11/2018      Component Value Date/Time   NA 140 11/11/2018 1808   K 4.4 11/11/2018 1808   CL 106 11/11/2018 1808   CO2 27 11/11/2018 1808   GLUCOSE 90 11/11/2018 1808   BUN 12 11/11/2018 1808   CREATININE 0.84 11/11/2018 1808   CALCIUM 9.5 11/11/2018 1808   PROT 8.2 (H) 09/10/2015 1635   ALBUMIN 4.5 09/10/2015 1635   AST 23 09/10/2015 1635   ALT 17 09/10/2015 1635   ALKPHOS 70 09/10/2015 1635   BILITOT 0.5 09/10/2015 1635   GFRNONAA >60 11/11/2018 1808   GFRAA >60 11/11/2018 1808   Lab Results  Component Value Date   CHOL 164 07/15/2014   HDL 34.60 (L) 07/15/2014   LDLCALC 104 (H) 07/15/2014   TRIG 125.0 07/15/2014   CHOLHDL 5 07/15/2014   Lab Results  Component Value Date   HGBA1C 5.2 10/30/2014   No results found for: VITAMINB12 Lab Results  Component Value Date   TSH 1.36 07/15/2014       ASSESSMENT AND PLAN  Dysesthesia - Plan: NCV with EMG(electromyography)  Numbness on left side - Plan: NCV with EMG(electromyography)  Cavernous angioma - Plan: EEG adult  Episodic altered awareness - Plan: EEG adult   In summary, Ms. Scalisi is a  18 year old woman who has had intermittent episodes of  left-sided numbness over the past couple weeks.  Currently she is no longer experience episodes in the legs and the episodes in the left side only involve her hand instead of the arm.  However, she continues to experience the episodes with painful dysesthesias in the left face.  No etiology of the symptoms is definite from the MRI.  There is no evidence of MS.  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  It is fairly small and would not be expected to cause symptoms.  Because of the episodic nature and with her having mild altered awareness after some of the early episodes, we need to rule out seizures and an EEG will be performed.   Additionally, she has a Tinel's and Phalen sign on the left and we will check a nerve conduction study/EMG to see if left carpal tunnel syndrome could explain some of her symptoms.  Her tandem gait was slightly wide.  If symptoms persist we would need to check an MRI of the cervical spine to rule out transverse myelitis and compressive myelopathy.  These processes could explain symptoms in the left limbs but not the entire left face.  I will see her back when she returns for the EMG.  She should call sooner if she has new or worsening neurologic symptoms.  Thank you for asking me to see Ms. Worster.  Please let me know if I can be of further assistance with her or other patients in the future.     Blaize Epple A. Felecia Shelling, MD, [facility] 7/74/1423, 9:53 AM Certified in Neurology, Clinical Neurophysiology, Sleep Medicine, Pain Medicine and Neuroimaging  [facility] [address] [place], [place] [address] [phone]

## 2018-11-20 ENCOUNTER — Encounter (INDEPENDENT_AMBULATORY_CARE_PROVIDER_SITE_OTHER): Payer: 59 | Admitting: Neurology

## 2018-11-20 ENCOUNTER — Other Ambulatory Visit: Payer: Self-pay

## 2018-11-20 ENCOUNTER — Ambulatory Visit (INDEPENDENT_AMBULATORY_CARE_PROVIDER_SITE_OTHER): Payer: 59 | Admitting: Neurology

## 2018-11-20 DIAGNOSIS — G5603 Carpal tunnel syndrome, bilateral upper limbs: Secondary | ICD-10-CM | POA: Diagnosis not present

## 2018-11-20 DIAGNOSIS — R200 Anesthesia of skin: Secondary | ICD-10-CM

## 2018-11-20 DIAGNOSIS — R208 Other disturbances of skin sensation: Secondary | ICD-10-CM | POA: Diagnosis not present

## 2018-11-20 DIAGNOSIS — Z0289 Encounter for other administrative examinations: Secondary | ICD-10-CM

## 2018-11-20 NOTE — Progress Notes (Signed)
Full Name: Michelle Hancock Gender: Female MRN #: [id number] Date of Birth: 29-Apr-2001    Visit Date: 11/20/2018 09:02 Age: 18 Years 43 Months Old Examining Physician: Arlice Colt, MD  Referring Physician: Arlice Colt, MD     History:  Michelle Hancock is a 18 year old woman who is having episodes of left sided numbness with more consistent numbness in the left arm and intermittent weakness in the left hand.  On examination today, she has Tinel's and Phalen's sign on the left.  Strength was 4+/5 in the left APB muscle and 5/5 elsewhere in both arms.  Sensation was normal and symmetric.  Nerve conduction studies:  The left median motor response had a delayed distal latency but normal amplitude and forearm conduction.  The right median, right ulnar and left ulnar motor responses were normal.  Ulnar F-wave responses were normal.  Bilateral median sensory responses were slowed across the wrist, worse on the left.  Additionally there was a reduced amplitude for the left median sensory response.  Bilateral ulnar responses in the left radial sensory response were normal.  Electromyography: Needle EMG of selected muscles of the left arm was performed.  No spontaneous activity was noted in any of the muscles tested.  There were normal motor unit action potentials and normal recruitment in the deltoid, triceps, biceps, EDC, first dorsal interosseous and abductor pollicis brevis muscles.  Impression/Plan: This EMG/NCV study shows the following: 1.   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 2.   Mild median neuropathy across the right wrist. 3.   There was no evidence of a superimposed left radiculopathy. 4.   The patient was advised to obtain a wrist splint for carpal tunnel to wear at night.  If symptoms persist consider referral to an orthopedic hand surgeon.  Michelle Hancock A. Felecia Shelling, MD, PhD, FAAN Certified in Neurology, Clinical Neurophysiology, Sleep Medicine, Pain Medicine and Neuroimaging  Director, [facility] [place], [place] [address] Tel: [phone] Fax: [phone]        [facility]    Nerve / Sites Muscle Latency Ref. Amplitude Ref. Rel Amp Segments Distance Velocity Ref. Area    ms ms mV mV %  cm m/s m/s mVms  R Median - APB     Wrist APB 4.0 ?4.4 10.2 ?4.0 100 Wrist - APB 7   36.2     Upper arm APB 8.1  9.3  90.7 Upper arm - Wrist 23 55 ?49 35.8  L Median - APB     Wrist APB 4.8 ?4.4 9.1 ?4.0 100 Wrist - APB 7   33.4     Upper arm APB 8.8  8.4  91.5 Upper arm - Wrist 23 57 ?49 33.1  R Ulnar - ADM     Wrist ADM 2.4 ?3.3 10.1 ?6.0 100 Wrist - ADM 7   39.4     B.Elbow ADM 5.9  9.6  94.9 B.Elbow - Wrist 21 59 ?49 37.8     A.Elbow ADM 7.8  9.7  101 A.Elbow - B.Elbow 10 55 ?49 37.2         A.Elbow - Wrist      L Ulnar - ADM     Wrist ADM 2.1 ?3.3 11.2 ?6.0 100 Wrist - ADM 7   43.9     B.Elbow ADM 5.8  10.8  96.8 B.Elbow - Wrist 21 56 ?49 41.7     A.Elbow ADM 7.7  10.8  99.2 A.Elbow - B.Elbow 10 55 ?49 41.8         A.Elbow - Wrist                 SNC    Nerve / Sites Rec. Site Peak Lat Ref.  Amp Ref. Segments Distance Peak Diff Ref.    ms ms V V  cm ms ms  L Radial - Anatomical snuff box (Forearm)     Forearm Wrist 2.3 ?2.9 21 ?15 Forearm - Wrist 10    R Median, Ulnar - Transcarpal comparison     Median Palm Wrist 3.1 ?2.2 36 ?35 Median Palm - Wrist 8       Ulnar Palm Wrist 1.7 ?2.2 19 ?12 Ulnar Palm - Wrist 8          Median Palm - Ulnar Palm  1.4 ?0.4  L Median, Ulnar - Transcarpal comparison     Median Palm Wrist 3.6 ?2.2 41 ?35 Median Palm - Wrist 8       Ulnar Palm Wrist 1.7 ?2.2 14 ?12 Ulnar Palm - Wrist 8          Median Palm - Ulnar Palm  1.9 ?0.4  R Median - Orthodromic (Dig II, Mid palm)     Dig II Wrist 4.1 ?3.4 9 ?10 Dig II - Wrist 13    L Median - Orthodromic (Dig II, Mid palm)     Dig II Wrist 4.8 ?3.4 4 ?10 Dig II - Wrist 13    R Ulnar - Orthodromic,  (Dig V, Mid palm)     Dig V Wrist 2.4 ?3.1 8 ?5 Dig V - Wrist 11    L Ulnar - Orthodromic, (Dig V, Mid palm)     Dig V Wrist 2.4 ?3.1 8 ?5 Dig V - Wrist 33                     F  Wave    Nerve F Lat Ref.   ms ms  R Ulnar - ADM 26.0 ?32.0  L Ulnar - ADM 26.1 ?32.0         EMG full       EMG Summary Table    Spontaneous MUAP Recruitment  Muscle IA Fib PSW Fasc Other Amp Dur. Poly Pattern  L. Deltoid Normal None None None _______ Normal Normal Normal Normal  L. Triceps brachii Normal None None None _______ Normal Normal Normal Normal  L. Biceps brachii Normal None None None _______ Normal Normal Normal Normal  L. Extensor digitorum communis Normal None None None _______ Normal Normal Normal Normal  L. First dorsal interosseous Normal None None None _______ Normal Normal Normal Normal  L. Abductor pollicis brevis Normal None None None _______ Normal Normal Normal Normal

## 2018-12-03 ENCOUNTER — Ambulatory Visit (INDEPENDENT_AMBULATORY_CARE_PROVIDER_SITE_OTHER): Payer: 59 | Admitting: Neurology

## 2018-12-03 ENCOUNTER — Other Ambulatory Visit: Payer: Self-pay

## 2018-12-03 DIAGNOSIS — R404 Transient alteration of awareness: Secondary | ICD-10-CM

## 2018-12-03 DIAGNOSIS — R4182 Altered mental status, unspecified: Secondary | ICD-10-CM | POA: Diagnosis not present

## 2018-12-03 DIAGNOSIS — D1800 Hemangioma unspecified site: Secondary | ICD-10-CM

## 2018-12-04 NOTE — Progress Notes (Signed)
   [facility]  EEG (ELECTROENCEPHALOGRAM) REPORT   STUDY DATE: 12/03/2018 PATIENT NAME: Michelle Hancock DOB: 17-Mar-2001 MRN: [id number]  ORDERING CLINICIAN: Sater  TECHNOLOGIST: Silas Sacramento, RPSGT TECHNIQUE: Electroencephalogram was recorded utilizing standard 10-20 system of lead placement and reformatted into average and bipolar montages.  RECORDING TIME: 23 min 57 sec  CLINICAL INFORMATION: 18 year old woman with intermittent episodes of numbness and transient alteration of awareness  FINDINGS: A digital EEG was performed while the patient was awake and drowsy. While awake and most alert there was a 11 hz posterior dominant rhythm. Voltages and frequencies were symmetric.  There were no focal, lateralizing, epileptiform activity or seizures seen.  Photic stimulation had a normal driving response. Hyperventilation and recovery did not change the underlying rhythms. EKG channel shows normal sinus rhythm.  Towards the end of the recording the patient had stage I and stage II sleep.  IMPRESSION: This is a normal EEG while the patient was awake and asleep.   INTERPRETING PHYSICIAN:   Richard A. Felecia Shelling, MD, PhD, [facility] Certified in Neurology, Clinical Neurophysiology, Sleep Medicine, Pain Medicine and Neuroimaging  [facility] [address], [place], [place] [address] [phone]

## 2018-12-05 ENCOUNTER — Telehealth: Payer: Self-pay | Admitting: *Deleted

## 2018-12-05 NOTE — Telephone Encounter (Signed)
-----   Message from Britt Bottom, MD sent at 12/04/2018  9:12 PM EDT ----- Regarding: EEG result Please let her know that the EEG was normal.

## 2018-12-05 NOTE — Telephone Encounter (Signed)
Since she is having paresthesias, that start gabapentin 300 mg #120 1 in the morning, 1 in the pm and 2 at night with 3 refills

## 2018-12-05 NOTE — Telephone Encounter (Signed)
Called pt. Relayed results per Dr. Felecia Shelling note. She verbalized understanding but wanted to know next steps. She reports sx have worsened. Paresthesia has worsened. She now has numbness in ear, nose, tongue and throat on left side. Whole left side now affected. Advised I will send a message to Dr. Felecia Shelling and call back for next steps.

## 2018-12-06 MED ORDER — GABAPENTIN 300 MG PO CAPS: ORAL_CAPSULE | ORAL | 3 refills | Status: DC

## 2018-12-06 NOTE — Telephone Encounter (Signed)
Called pt. Relayed Dr. Garth Bigness recommendation. Pt agreeable to this. I escribed rx and asked her to call back if she has further questions/any SE.

## 2018-12-18 ENCOUNTER — Telehealth: Payer: Self-pay | Admitting: Neurology

## 2018-12-18 NOTE — Telephone Encounter (Signed)
Please let her know that trigeminal neuralgia would not be expected to cause numbness but tends to usually be shooting pain lasting a split second multiple times a day  If symptoms have not resolved, and she is continuing to experience the facial numbness and pain we could try a medication to see if that makes it better.  If this is occurring call in Oxcarbazepine 300 g twice a day.  #60 #3

## 2018-12-18 NOTE — Telephone Encounter (Signed)
Dr. Sater- please advise 

## 2018-12-18 NOTE — Telephone Encounter (Signed)
Pt was wanting to know if Trigeminal Neuralgia has been considered since nothing has been resolved after the EEG and everything else that has been done. Please advise.

## 2018-12-19 MED ORDER — OXCARBAZEPINE 300 MG PO TABS: 300.0000 mg | ORAL_TABLET | Freq: Two times a day (BID) | ORAL | 3 refills | Status: DC

## 2018-12-19 NOTE — Telephone Encounter (Signed)
Called pt. Relayed Dr. Garth Bigness message. Pt agreeable to try oxcarbazepine. I explained directions. She would like rx to go to  [id number] - [place], [place] - [address] [phone] (Phone) [phone] (Fax)   I e-scribed and asked that she call back if she has further questions/concerns.

## 2018-12-28 ENCOUNTER — Other Ambulatory Visit: Payer: Self-pay | Admitting: Neurology

## 2019-04-09 ENCOUNTER — Ambulatory Visit: Payer: 59 | Admitting: Neurology

## 2019-04-11 ENCOUNTER — Ambulatory Visit (INDEPENDENT_AMBULATORY_CARE_PROVIDER_SITE_OTHER): Payer: 59 | Admitting: Orthopaedic Surgery

## 2019-04-11 ENCOUNTER — Encounter: Payer: Self-pay | Admitting: Orthopaedic Surgery

## 2019-04-11 ENCOUNTER — Other Ambulatory Visit: Payer: Self-pay

## 2019-04-11 DIAGNOSIS — G5602 Carpal tunnel syndrome, left upper limb: Secondary | ICD-10-CM

## 2019-04-11 NOTE — Progress Notes (Signed)
Office Visit Note   Patient: Michelle Hancock           Date of Birth: 12-Jun-2001           MRN: [id number] Visit Date: 04/11/2019              Requested by: No referring provider defined for this encounter. PCP: System, Pcp Not In   Assessment & Plan: Visit Diagnoses:  1. Carpal tunnel syndrome, left upper limb     Plan: Due to the fact that she is failed conservative treatment and given her findings of moderate left median nerve entrapment recommend carpal tunnel release.  Discussed with her the surgical procedure and postoperative protocol.  Questions were encouraged and answered by Dr. Ninfa Linden self.  We will proceed with left carpal tunnel release in the near future.  Follow-up 2 weeks postop Follow-Up Instructions: Return 2 weeks post-op.   Orders:  No orders of the defined types were placed in this encounter.  No orders of the defined types were placed in this encounter.     Procedures: No procedures performed   Clinical Data: No additional findings.   Subjective: Chief Complaint  Patient presents with  . Right Hand - Pain  . Left Hand - Pain    HPI Patient is a 18 year old female comes in today due to left-sided numbness involving her left arm and hand.  Awakens her.  She is tried splints and anti-inflammatories.  She has some numbness tingling in the right hand but not to the extent that she has on the left.  She underwent nerve conduction studies which showed moderate left median nerve entrapment and mild right median nerve entrapment.  She is right-hand dominant.  She feels her symptoms are getting worse.  Review of Systems Denies any fevers, chills, chest pain or shortness of breath.  Objective: Vital Signs: There were no vitals taken for this visit.  Physical Exam Constitutional:      Appearance: She is not ill-appearing or diaphoretic.  Cardiovascular:     Pulses: Normal pulses.  Pulmonary:     Effort: Pulmonary effort is normal.  Neurological:    Mental Status: She is alert and oriented to person, place, and time.  Psychiatric:        Mood and Affect: Mood normal.     Ortho Exam Positive Tinel's over the left wrist at the median nerve.  Positive compression test bilaterally median nerve at the wrist.  Positive Phalen's left greater than right.  Sensation grossly intact bilateral hands.  No rash skin lesions ulcerations.  Full motor both hands. Specialty Comments:  No specialty comments available.  Imaging: No results found.   PMFS History: Patient Active Problem List   Diagnosis Date Noted  . Carpal tunnel syndrome on both sides 11/20/2018  . Dysesthesia 11/15/2018  . Numbness on left side 11/15/2018  . 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 Episodic altered awareness 11/15/2018  . Patent pressure equalization (PE) tubes, bilateral 04/11/2016  . Irregular menstrual cycle 07/11/2014  . Abnormal menstrual periods 07/11/2014  . BMI, pediatric > 99% for age 60/24/2014  . Headache(784.0) 04/26/2013  . Well adolescent visit 04/26/2013  . Persistent cough 02/26/2011  . POSTURAL LIGHTHEADEDNESS 11/20/2007  . COUGH 05/25/2007   Past Medical History:  Diagnosis Date  . Confluent and reticulated papillomatosis (CARP)   . PCOS (polycystic ovarian syndrome)   . Preauricular tag   . UTI (lower urinary tract infection)     Family History  Problem Relation Age  of Onset  . Allergies Father   . Gestational diabetes Mother   . Hypertension Mother   . Hyperlipidemia Mother   . Breast cancer Maternal Aunt     Past Surgical History:  Procedure Laterality Date  . MYRINGOTOMY     tonsils   Social History   Occupational History  . Occupation: Hydrographic surveyor  Tobacco Use  . Smoking status: Never Smoker  . Smokeless tobacco: Never Used  Substance and Sexual Activity  . Alcohol use: No  . Drug use: No  . Sexual activity: Never

## 2019-04-23 ENCOUNTER — Telehealth: Payer: Self-pay | Admitting: Neurology

## 2019-04-23 NOTE — Telephone Encounter (Signed)
Dr. Sater- please advise 

## 2019-04-23 NOTE — Telephone Encounter (Signed)
An MRI would not cause the pain.  It is okay with me if she switches.

## 2019-04-23 NOTE — Telephone Encounter (Signed)
Dr. Jaynee Eagles- are you okay with pt switching to you?

## 2019-04-23 NOTE — Telephone Encounter (Signed)
Patient calling and states for the past week she has been having headaches on the right side of her head. Patient states she also can feel it in her right eye. Patient want's to know could this be coming from the MRI she just had ?    Patient is also wanting to switch providers she would like to see a female and she would like to see Dr. Jaynee Eagles.

## 2019-04-24 NOTE — Telephone Encounter (Signed)
That's fine happy to see them

## 2019-04-25 NOTE — Telephone Encounter (Signed)
I spoke with pt & scheduled her for Wed 05/15/2019 @ 1:00 pm arrival 15-30 minutes early. Pt verbalized appreciation and her questions were answered.

## 2019-04-25 NOTE — Telephone Encounter (Signed)
Michelle Hancock, pt would like to be schedule with Dr. Jaynee Eagles instead. She is switching providers to her

## 2019-05-15 ENCOUNTER — Encounter: Payer: Self-pay | Admitting: Neurology

## 2019-05-15 ENCOUNTER — Ambulatory Visit: Payer: 59 | Admitting: Neurology

## 2019-05-15 ENCOUNTER — Other Ambulatory Visit: Payer: Self-pay

## 2019-05-15 VITALS — BP 102/70 | HR 88 | Temp 98.0°F | Ht 67.0 in | Wt 235.0 lb

## 2019-05-15 DIAGNOSIS — G441 Vascular headache, not elsewhere classified: Secondary | ICD-10-CM

## 2019-05-15 DIAGNOSIS — D1800 Hemangioma unspecified site: Secondary | ICD-10-CM | POA: Diagnosis not present

## 2019-05-15 DIAGNOSIS — R519 Headache, unspecified: Secondary | ICD-10-CM

## 2019-05-15 NOTE — Patient Instructions (Signed)
CT head Cavernous Malformation, Adult A cavernous malformation is an abnormal formation of blood vessels that are more likely than regular blood vessels to bleed (hemorrhage) and cause other problems. A cavernous malformation most often develops in or beneath the skin, but it can also develop in internal organs. It can be very dangerous if it develops in the brain or the spinal cord. Most cavernous malformations are present since birth (congenital). What are the causes? This condition may be caused by a gene that is passed down from parent to child (inherited). In some cases, the cause is not known. What increases the risk? A family history of cavernous malformation is the only known risk factor. What are the signs or symptoms? Symptoms of this condition include:  Headache.  Nausea or vomiting.  Dizziness.  Vision or hearing changes.  Seizure.  Weakness or loss of movement in a part of the body.  Tingling or numbness in part of the body.  Problems with speech.  Clumsiness.  Confusion or loss of memory. Symptoms usually develop only if specific areas of the brain or spinal cord are affected. How is this diagnosed? This condition is diagnosed with an imaging test, such as MRI or a CT scan. How is this treated? Treatment for this condition depends on whether symptoms are present. If you do not have symptoms or a malformation that has caused bleeding, you may only need to have regular MRIs to watch the malformation over time. You may also get medicines to treat any seizures or headaches. If you have symptoms or a malformation that has caused bleeding, surgery to remove the cavernous malformation (craniotomy) will be needed. In areas that cannot be safely operated on, a focused beam of radiation (gamma knife) may be used to shrink a cavernous malformation. Follow these instructions at home:  Learn as much as you can about your condition and work closely with your team of health care  providers.  Take over-the-counter and prescription medicines only as told by your health care provider. Do not take blood thinners, such as aspirin, ibuprofen, NSAIDs, or warfarin, unless your health care provider tells you to do that.  Keep all follow-up visits as told by your health care provider. This is important. Contact a health care provider if:  Your symptoms do not improve with treatment.  You develop new symptoms.  You develop a headache that does not go away.  Your symptoms return. Get help right away if:  You have a sudden, severe headache with no known cause.  You have nausea or vomiting occurring with another symptom.  You have sudden weakness or numbness of your face, arm, or leg, especially on one side of your body.  You have sudden trouble walking or difficulty moving your arms or legs.  You have sudden confusion.  You have sudden trouble speaking, understanding, or both (aphasia).  You have sudden trouble seeing in one or both of your eyes.  You have a sudden loss of balance or coordination.  You have a stiff neck.  You have difficulty breathing.  You have a partial or total loss of consciousness. These symptoms may represent a serious problem that is an emergency. Do not wait to see if the symptoms will go away. Get medical help right away. Call your local emergency services (911 in the [place] Do not drive yourself to the hospital. This information is not intended to replace advice given to you by your health care provider. Make sure you discuss any questions you  have with your health care provider. Document Released: 03/12/2002 Document Revised: 06/02/2017 Document Reviewed: 10/30/2015 Elsevier Patient Education  2020 [employer].

## 2019-05-15 NOTE — Progress Notes (Addendum)
[facility]    Provider:  Dr Jaynee Eagles Primary Care Provider:  Lois Huxley, PA  CC:  *headache  HPI:  Michelle Hancock is a 18 y.o. female here for follow up.  She was seen my colleague Arlice Colt and I reviewed his notes.  Patient presented to the emergency room in May 2020 with intermittent left-sided facial numbness for the previous week, V1 V2 and V3 was involved, tingling that would intensify with pain, pain lasting a couple minutes then would resolve, also reported intermittent left arm numbness for several days, mild headache, CT scan was performed and was normal.  On May 10 she returned with similar symptoms and reporting weakness in the left arm and leg like they are falling asleep less painful than the face coming and going over a few minutes and an MRI of the brain was ordered.  For intermittent numbness and transient alteration of awareness and EEG was performed which was normal.  Patient was having left-sided numbness with more numbness in the left arm and intermittent weakness in the left hand.  On examination she had Tinel's and Phalen sign in the left, strength was 4+ out of 5 in the left APB muscle and 5 out of 5 elsewhere.  Sensation was normal and symmetric.  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    The symptoms she was Dr. Felecia Shelling for resolved. She has a headache. The pain is on the right side of the head. Different than an average headache, pressure behind the eye. Started 2 weeks ago, never had the headaches or anything like that, she has had nausea, daily. No light or sound sensitivity. No vision changes. Lasts a few minutes, pressure, she can feel a little throbbing. She has it 2-3 times a day, no triggers, not related to sleep, she has not hit her head, no weakness, only the right side. It is moreso  in the front area, not jabbing or electric, no eye watering or nose running, 6-7/10 pain, closes eyes. Father has migraines. No change in diet, sleep has been different waking up but without headache.   Reviewed notes, labs and imaging from outside physicians, which showed:  I reviewed MRI images of the brain which showed an angioma in the right hemisphere(reviwed with patient as well).  Cbc/bmp normal 11/2018  Review of Systems: Patient complains of symptoms per HPI as well as the following symptoms:headache. Pertinent negatives and positives per HPI. All others negative.   Social History   Socioeconomic History  . Marital status: Single    Spouse name: Not on file  . Number of children: 0  . Years of education: Not on file  . Highest education level: Not on file  Occupational History  . Occupation: Hydrographic surveyor  Social Needs  . Financial resource strain: Not on file  . Food insecurity    Worry: Not on file    Inability: Not on file  . Transportation needs    Medical: Not on file    Non-medical: Not on file  Tobacco Use  . Smoking status: Never Smoker  . Smokeless tobacco: Never Used  Substance and Sexual Activity  . Alcohol use: No  . Drug use: No  . Sexual activity: Never  Lifestyle  . Physical activity    Days per week: Not on file    Minutes per  session: Not on file  . Stress: Not on file  Relationships  . Social Herbalist on phone: Not on file    Gets together: Not on file    Attends religious service: Not on file    Active member of club or organization: Not on file    Attends meetings of clubs or organizations: Not on file    Relationship status: Not on file  . Intimate partner violence    Fear of current or ex partner: Not on file    Emotionally abused: Not on file    Physically abused: Not on file    Forced sexual activity: Not on file  Other Topics Concern  . Not on file  Social History Narrative   Lives w/ family (mom, dad, sister)    Caffeine use: none   Pets Dog outside and Cat   hho f 4   [employer]  9th grade    No ets          Family History  Problem Relation Age of Onset  . Allergies Father   . Migraines Father   . Gestational diabetes Mother   . Hypertension Mother   . Hyperlipidemia Mother   . Breast cancer Maternal Aunt   . Heart attack Maternal Grandmother     Past Medical History:  Diagnosis Date  . Carpal tunnel syndrome    left; surgery planned for December 2020  . Confluent and reticulated papillomatosis (CARP)   . PCOS (polycystic ovarian syndrome)   . Preauricular tag   . UTI (lower urinary tract infection)     Patient Active Problem List   Diagnosis Date Noted  . Carpal tunnel syndrome on both sides 11/20/2018  . Dysesthesia 11/15/2018  . Numbness on left side 11/15/2018  . 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 Episodic altered awareness 11/15/2018  . Patent pressure equalization (PE) tubes, bilateral 04/11/2016  . Irregular menstrual cycle 07/11/2014  . Abnormal menstrual periods 07/11/2014  . BMI, pediatric > 99% for age 55/24/2014  . Headache(784.0) 04/26/2013  . Well adolescent visit 04/26/2013  . Persistent cough 02/26/2011  . POSTURAL LIGHTHEADEDNESS 11/20/2007  . COUGH 05/25/2007    Past Surgical History:  Procedure Laterality Date  . MYRINGOTOMY     tonsils  . WISDOM TOOTH EXTRACTION     x4    Current Outpatient Medications  Medication Sig Dispense Refill  . BENZEPRO FOAMING CLOTHS 6 % MISC at bedtime.     . Minocycline HCl 135 MG TB24     . norethindrone (AYGESTIN) 5 MG tablet     . tazarotene (AVAGE) 0.1 % cream Apply topically at bedtime.     . gabapentin (NEURONTIN) 300 MG capsule TAKE 1 CAPSULE IN THE MORNING, 1 CAPSULE IN THE PM AND 2 CAPSULES AT BEDTIME. (Patient not taking: Reported on 05/15/2019) 360 capsule 2  . Oxcarbazepine (TRILEPTAL) 300 MG tablet Take 1 tablet (300 mg total) by mouth 2 (two) times daily. (Patient not taking: Reported on 05/15/2019) 60  tablet 3  . spironolactone (ALDACTONE) 50 MG tablet Take 50 mg by mouth daily.    Marland Kitchen UNABLE TO FIND Med Name: 10% benzyl peroxide face wash     No current facility-administered medications for this visit.     Allergies as of 05/15/2019 - Review Complete 05/15/2019  Allergen Reaction Noted  . Tacrolimus Hives 03/09/2018    Vitals: BP 102/70 (BP Location: Right Arm, Patient Position: Sitting)   Pulse 88   Temp 98 F (36.7  C) Comment: taken at front door  Ht 5\' 7"  (1.702 m)   Wt 235 lb (106.6 kg)   BMI 36.81 kg/m  Last Weight:  Wt Readings from Last 1 Encounters:  05/15/19 235 lb (106.6 kg)   Last Height:   Ht Readings from Last 1 Encounters:  05/15/19 5\' 7"  (1.702 m)     Physical exam: Exam: Gen: NAD, conversant, well nourised, obese, well groomed                     CV: RRR, no MRG. No Carotid Bruits. No peripheral edema, warm, nontender Eyes: Conjunctivae clear without exudates or hemorrhage  Neuro: Detailed Neurologic Exam  Speech:    Speech is normal; fluent and spontaneous with normal comprehension.  Cognition:    The patient is oriented to person, place, and time;     recent and remote memory intact;     language fluent;     normal attention, concentration,     fund of knowledge Cranial Nerves:    The pupils are equal, round, and reactive to light. The fundi are normal and spontaneous venous pulsations are present. Visual fields are full to finger confrontation. Extraocular movements are intact. Trigeminal sensation is intact and the muscles of mastication are normal. The face is symmetric. The palate elevates in the midline. Hearing intact. Voice is normal. Shoulder shrug is normal. The tongue has normal motion without fasciculations.   Coordination:    Normal finger to nose and heel to shin. Normal rapid alternating movements.   Gait:    Heel-toe and tandem gait are normal.   Motor Observation:    No asymmetry, no atrophy, and no involuntary movements  noted. Tone:    Normal muscle tone.    Posture:    Posture is normal. normal erect    Strength:    Strength is V/V in the upper and lower limbs.      Sensation: intact to LT     Reflex Exam:  DTR's:    Deep tendon reflexes in the upper and lower extremities are normal bilaterally.   Toes:    The toes are downgoing bilaterally.   Clonus:    Clonus is absent.    Assessment/Plan:  18 year old patient here with new right frontal headaches.  May be migrainous but given angioma in the same location need to get a CT head to ensure no bleeding. She declind any medication for now.  Orders Placed This Encounter  Procedures  . CT HEAD WO CONTRAST    Discussed: To prevent or relieve headaches, try the following: Cool Compress. Lie down and place a cool compress on your head.  Avoid headache triggers. If certain foods or odors seem to have triggered your migraines in the past, avoid them. A headache diary might help you identify triggers.  Include physical activity in your daily routine. Try a daily walk or other moderate aerobic exercise.  Manage stress. Find healthy ways to cope with the stressors, such as delegating tasks on your to-do list.  Practice relaxation techniques. Try deep breathing, yoga, massage and visualization.  Eat regularly. Eating regularly scheduled meals and maintaining a healthy diet might help prevent headaches. Also, drink plenty of fluids.  Follow a regular sleep schedule. Sleep deprivation might contribute to headaches Consider biofeedback. With this mind-body technique, you learn to control certain bodily functions - such as muscle tension, heart rate and blood pressure - to prevent headaches or reduce headache pain.  Proceed to emergency room if you experience new or worsening symptoms or symptoms do not resolve, if you have new neurologic symptoms or if headache is severe, or for any concerning symptom.    Cc:  Lois Huxley, [place]     Sarina Ill,  MD  [facility] [address] [place], [place] [address]  Phone [phone] Fax [phone]

## 2019-05-19 ENCOUNTER — Encounter: Payer: Self-pay | Admitting: Neurology

## 2019-06-06 ENCOUNTER — Telehealth: Payer: Self-pay | Admitting: Orthopaedic Surgery

## 2019-06-06 ENCOUNTER — Other Ambulatory Visit: Payer: Self-pay | Admitting: Orthopaedic Surgery

## 2019-06-06 DIAGNOSIS — G5602 Carpal tunnel syndrome, left upper limb: Secondary | ICD-10-CM

## 2019-06-06 MED ORDER — HYDROCODONE-ACETAMINOPHEN 5-325 MG PO TABS: 1.0000 | ORAL_TABLET | Freq: Four times a day (QID) | ORAL | 0 refills | Status: DC | PRN

## 2019-06-06 NOTE — Telephone Encounter (Signed)
Patient aware note at front desk  

## 2019-06-06 NOTE — Telephone Encounter (Signed)
Patient called requesting an out ofwork note due to surgery [id number].  Please call patient [phone]

## 2019-06-17 ENCOUNTER — Telehealth: Payer: Self-pay

## 2019-06-17 ENCOUNTER — Other Ambulatory Visit: Payer: Self-pay | Admitting: Orthopaedic Surgery

## 2019-06-17 MED ORDER — DOXYCYCLINE HYCLATE 100 MG PO TABS: 100.0000 mg | ORAL_TABLET | Freq: Two times a day (BID) | ORAL | 0 refills | Status: DC

## 2019-06-17 NOTE — Telephone Encounter (Signed)
Patient left a VM stating that she slept on her left hand last night and it was the hand that she had carpal tunnel release on and that the incision is open a little bit to where she can see red, but not a lot.  Wanted to know what she needs to do or would you like for me to work her in?  CB# is [phone].  Please advise.  Thank you.

## 2019-06-17 NOTE — Telephone Encounter (Signed)
See below

## 2019-06-17 NOTE — Telephone Encounter (Signed)
Have her place triple antibiotic ointment on her incision daily.  I will also send in an antibiotic.  We would then see her at her regular follow-up appointment unless things worsen in any way.

## 2019-06-17 NOTE — Telephone Encounter (Signed)
Patient aware of the below message  

## 2019-06-20 ENCOUNTER — Ambulatory Visit (INDEPENDENT_AMBULATORY_CARE_PROVIDER_SITE_OTHER): Payer: 59 | Admitting: Orthopaedic Surgery

## 2019-06-20 ENCOUNTER — Encounter: Payer: Self-pay | Admitting: Orthopaedic Surgery

## 2019-06-20 ENCOUNTER — Other Ambulatory Visit: Payer: Self-pay

## 2019-06-20 DIAGNOSIS — G5602 Carpal tunnel syndrome, left upper limb: Secondary | ICD-10-CM

## 2019-06-20 DIAGNOSIS — Z9889 Other specified postprocedural states: Secondary | ICD-10-CM

## 2019-06-20 NOTE — Progress Notes (Signed)
The patient is 2 weeks status post a left open carpal tunnel release.  She is doing well overall.  She reports minimal numbness and tingling.  On exam remove the sutures in place Steri-Strips from the palm of her hand.  She is able to move her thumb and fingers.  They are well perfused.  Her sensation is also improved.  At this point she will slowly increase her activities as comfort allows.  I have her move her fingers and thumb but no heavy gripping activities and heavy lifting.  I gave her a note to keep her out of work until Monday, July 08, 2019.

## 2019-07-18 ENCOUNTER — Ambulatory Visit (INDEPENDENT_AMBULATORY_CARE_PROVIDER_SITE_OTHER): Payer: 59 | Admitting: Orthopaedic Surgery

## 2019-07-18 ENCOUNTER — Encounter: Payer: Self-pay | Admitting: Orthopaedic Surgery

## 2019-07-18 ENCOUNTER — Other Ambulatory Visit: Payer: Self-pay

## 2019-07-18 DIAGNOSIS — G5602 Carpal tunnel syndrome, left upper limb: Secondary | ICD-10-CM

## 2019-07-18 DIAGNOSIS — Z9889 Other specified postprocedural states: Secondary | ICD-10-CM

## 2019-07-18 NOTE — Progress Notes (Signed)
The patient is now 6 weeks status post left open carpal tunnel release.  She says she is doing very well overall.  She still little bit sore.  She is on 19 years old and she is back to work.  On examination she has excellent pinch and grip strength on the left operative side.  Her incisions healing nicely and is appropriately tender.  She has a well-perfused hand.  There is no evidence of muscle atrophy at all.  At this point she can follow-up as needed.  She understands it can still take 6 months to a year to get a full recovery but this should be quicker in someone who is only 19 years old.  She is not a smoker and not a diabetic.  She has had some mild symptoms of carpal tunnel on her right side.  Right now is not bothering her bad enough to do anything about.  She does have a night splint to wear but she has not been wearing it.  I have encouraged her to try wearing this when she needs to.  If her symptoms worsen in any way on the right side she will let us know.  All question concerns were answered and addressed.  Follow-up as otherwise as needed.

## 2019-07-20 ENCOUNTER — Telehealth (HOSPITAL_COMMUNITY): Payer: Self-pay | Admitting: Neurology

## 2019-07-20 NOTE — Telephone Encounter (Signed)
I returned call from the patient to the answering service.  She stated that she had a bad feeling and was not doing well felt a little nauseous specifically just before you are going under anesthesia.  She denied significant headache, blurred vision, focal extremity weakness numbness or tingling.  She asked me if her symptoms were related to the small venous angioma she had in her brain.  I assured her that I do not think the venous angioma is causing her symptoms and its likely incidental finding and probably will not cause any problems in her lifetime.  She was advised to call her primary care physician in case her symptoms persisted or return or she had new complaints.  She voiced understanding.

## 2019-08-28 ENCOUNTER — Telehealth: Payer: Self-pay | Admitting: Neurology

## 2019-08-28 NOTE — Telephone Encounter (Signed)
Pt has called back to inform she has reached out to an Ophthalmologist but the earliest she can be seen is 03-12th.  Pt wants to know if Dr Jaynee Eagles thinks that is too far out to wait, please call

## 2019-08-28 NOTE — Telephone Encounter (Signed)
Thanks [place], I order that CT last time I saw her and I have asked her twice to get it for her headaches, looks like she procrastinated getting the CT scheduled. So I am not sure why she keeps calling c/o headache without getting the CT I have advised her to get before we do anything further; I have told her I am concerned about her vascular malformation right in the area of where the headache is and I worry about more bleeding and thus we need to evaluate that. The headache apparently went away for a while and now she a headache with different symptom, she feels she has sand in her right eye and now this needs to be checked to make sure she doesn't have any eye issues or corneal abrasion, since that symptom is new. Just fyi for the records and ccing her pcp in case she calls her. Follow up with eye doctor for the eye symptoms, get the CT of the head, and we can see her back in the office for treatment of headache based on results. Thanks [place] Heron for you too)

## 2019-08-28 NOTE — Telephone Encounter (Signed)
Per Dr. Jaynee Eagles, see eye doctor to rule out any damage to cornea. Also recommend pt proceed with CT head that was previously ordered.   I spoke with the pt and let her know to see an eye doctor ASAP to r/o any cornea damage as this can cause the headaches and can even cause blindness. Pt also advised to have the CT done to rule out bleeding. She was given GI's number to call and was advised if she has trouble scheduling to call our office. I also let her know if her headache worsens, if she develops any numbness, weakness, tingling, speech trouble, balance trouble, anything "off" neurologically to call 911. She verbalized understanding and appreciation for the call.

## 2019-08-28 NOTE — Telephone Encounter (Signed)
Pt has declined scheduling an in office visit but asked this message be sent for a call back.  Pt states for a week she has continue to have her headaches on the right side of head and every evening/afternoon in her right eye there is a feeling of sand in her eye, pt also said its like a stinging feeling.  Please call

## 2019-08-28 NOTE — Telephone Encounter (Signed)
Spoke with Dr. Jaynee Eagles. Pt can see optometrist, if not, pcp or urgent care.   I spoke with the patient and let her know she doesn't have to see opthalmology. She can probably see optometry sooner and if she can't get in within the next 24 hours she should see PCP or urgent care. Pt verbalized appreciation. Her CT is 3/5.

## 2019-09-06 ENCOUNTER — Ambulatory Visit
Admission: RE | Admit: 2019-09-06 | Discharge: 2019-09-06 | Disposition: A | Discharge status: Home / Self Care | Payer: 59 | Source: Ambulatory Visit | Attending: Neurology | Admitting: Neurology

## 2019-09-06 DIAGNOSIS — G441 Vascular headache, not elsewhere classified: Secondary | ICD-10-CM | POA: Diagnosis not present

## 2019-09-06 DIAGNOSIS — D1800 Hemangioma unspecified site: Secondary | ICD-10-CM

## 2019-09-06 DIAGNOSIS — R519 Headache, unspecified: Secondary | ICD-10-CM

## 2019-09-24 ENCOUNTER — Telehealth: Payer: Self-pay | Admitting: Neurology

## 2019-09-24 NOTE — Telephone Encounter (Signed)
Called pt and LVM asking for call back during office hours. Left office number in message.

## 2019-09-24 NOTE — Telephone Encounter (Signed)
Pt called wanting to touch base in regards to post ct results. Pt states her headaches have returned and she is unsure if she should wait to see if it will settle over or if she should come in for and OV or get more tests/imaging done

## 2019-09-24 NOTE — Telephone Encounter (Signed)
Let her know it sounds like we may have to treat her for possible migraines. I am happy to see her is she would like to wait, if she doesn't want to wait then Amy can see her and start her on migraine medication.

## 2019-09-25 NOTE — Telephone Encounter (Signed)
Pt has called, messages were read to her.  Pt chose next avail with Amy, NP 3-31 check in 2:30

## 2019-09-25 NOTE — Telephone Encounter (Signed)
Noted thanks °

## 2019-10-02 ENCOUNTER — Ambulatory Visit: Payer: 59 | Admitting: Family Medicine

## 2019-12-14 ENCOUNTER — Other Ambulatory Visit: Payer: Self-pay

## 2019-12-14 ENCOUNTER — Emergency Department (HOSPITAL_BASED_OUTPATIENT_CLINIC_OR_DEPARTMENT_OTHER)
Admission: EM | Admit: 2019-12-14 | Discharge: 2019-12-14 | Disposition: A | Discharge status: Home / Self Care | Payer: 59 | Attending: Emergency Medicine | Admitting: Emergency Medicine

## 2019-12-14 ENCOUNTER — Encounter (HOSPITAL_BASED_OUTPATIENT_CLINIC_OR_DEPARTMENT_OTHER): Payer: Self-pay | Admitting: Emergency Medicine

## 2019-12-14 DIAGNOSIS — Z79899 Other long term (current) drug therapy: Secondary | ICD-10-CM | POA: Diagnosis not present

## 2019-12-14 DIAGNOSIS — M79661 Pain in right lower leg: Secondary | ICD-10-CM | POA: Diagnosis not present

## 2019-12-14 HISTORY — DX: Hemangioma unspecified site: D18.00

## 2019-12-14 LAB — CBC WITH DIFFERENTIAL/PLATELET
Abs Immature Granulocytes: 0.01 10*3/uL (ref 0.00–0.07)
Basophils Absolute: 0 10*3/uL (ref 0.0–0.1)
Basophils Relative: 1 %
Eosinophils Absolute: 0.1 10*3/uL (ref 0.0–0.5)
Eosinophils Relative: 2 %
HCT: 38.9 % (ref 36.0–46.0)
Hemoglobin: 12.7 g/dL (ref 12.0–15.0)
Immature Granulocytes: 0 %
Lymphocytes Relative: 25 %
Lymphs Abs: 1.3 10*3/uL (ref 0.7–4.0)
MCH: 30.2 pg (ref 26.0–34.0)
MCHC: 32.6 g/dL (ref 30.0–36.0)
MCV: 92.6 fL (ref 80.0–100.0)
Monocytes Absolute: 0.5 10*3/uL (ref 0.1–1.0)
Monocytes Relative: 10 %
Neutro Abs: 3.2 10*3/uL (ref 1.7–7.7)
Neutrophils Relative %: 62 %
Platelets: 317 10*3/uL (ref 150–400)
RBC: 4.2 MIL/uL (ref 3.87–5.11)
RDW: 12.6 % (ref 11.5–15.5)
WBC: 5.1 10*3/uL (ref 4.0–10.5)
nRBC: 0 % (ref 0.0–0.2)

## 2019-12-14 LAB — BASIC METABOLIC PANEL
Anion gap: 9 (ref 5–15)
BUN: 10 mg/dL (ref 6–20)
CO2: 26 mmol/L (ref 22–32)
Calcium: 9.1 mg/dL (ref 8.9–10.3)
Chloride: 104 mmol/L (ref 98–111)
Creatinine, Ser: 0.8 mg/dL (ref 0.44–1.00)
GFR calc Af Amer: >60 mL/min (ref >60)
GFR calc non Af Amer: >60 mL/min (ref >60)
Glucose, Bld: 110 mg/dL — ABNORMAL HIGH (ref 70–99)
Potassium: 4.1 mmol/L (ref 3.5–5.1)
Sodium: 139 mmol/L (ref 135–145)

## 2019-12-14 LAB — HCG, SERUM, QUALITATIVE: Preg, Serum: NEGATIVE

## 2019-12-14 LAB — D-DIMER, QUANTITATIVE: D-Dimer, Quant: <0.27 ug/mL-FEU (ref 0.00–0.50)

## 2019-12-14 MED ORDER — KETOROLAC TROMETHAMINE 60 MG/2ML IM SOLN
60.0000 mg | Freq: Once | INTRAMUSCULAR | Status: AC
  Administered 2019-12-14: 60 mg via INTRAMUSCULAR
  Filled 2019-12-14: qty 2

## 2019-12-14 MED ORDER — CYCLOBENZAPRINE HCL 10 MG PO TABS
10.0000 mg | ORAL_TABLET | Freq: Three times a day (TID) | ORAL | 0 refills | Status: DC | PRN
Start: 2019-12-14 — End: 2020-08-24

## 2019-12-14 NOTE — Discharge Instructions (Addendum)
Take ibuprofen 600 mg every 6 hours as needed for pain.  Begin taking Flexeril as prescribed as needed for pain not relieved with ibuprofen.  Rest.  Follow-up with primary doctor if symptoms are not improving in the next week.

## 2019-12-14 NOTE — ED Provider Notes (Signed)
[facility] EMERGENCY DEPARTMENT Provider Note   CSN: [id number] Arrival date & time: 12/14/19  0253     History Chief Complaint  Patient presents with   Leg Pain    Michelle Hancock is a 19 y.o. female.  Patient is a 19 year old female with no significant past medical history.  She presents today for evaluation of right calf pain.  She has had 2 episodes of this that woke her from sleep.  She denies any specific injury or trauma.  She denies any chest pain or shortness of breath.  She went to bed feeling well, then woke up with a spasm in the back of her leg that would not quit.  The history is provided by the patient.  Leg Pain Lower extremity pain location: Calf. Pain details:    Quality:  Sharp   Radiates to:  Does not radiate   Severity:  Severe   Onset quality:  Sudden   Timing:  Constant   Progression:  Unchanged      Past Medical History:  Diagnosis Date   Carpal tunnel syndrome    left; surgery planned for December 2020   Cavernous angioma    Confluent and reticulated papillomatosis (CARP)    PCOS (polycystic ovarian syndrome)    Preauricular tag    UTI (lower urinary tract infection)     Patient Active Problem List   Diagnosis Date Noted   Carpal tunnel syndrome on both sides 11/20/2018   Dysesthesia 11/15/2018   Numbness on left side 11/15/2018   Cavernous angioma 11/15/2018   Episodic altered awareness 11/15/2018   Patent pressure equalization (PE) tubes, bilateral 04/11/2016   Irregular menstrual cycle 07/11/2014   Abnormal menstrual periods 07/11/2014   BMI, pediatric > 99% for age 19/24/2014   Headache(784.0) 04/26/2013   Well adolescent visit 04/26/2013   Persistent cough 02/26/2011   POSTURAL LIGHTHEADEDNESS 11/20/2007   COUGH 05/25/2007    Past Surgical History:  Procedure Laterality Date   MYRINGOTOMY     tonsils   WISDOM TOOTH EXTRACTION     x4     OB History   No obstetric history on file.      Family History  Problem Relation Age of Onset   Allergies Father    Migraines Father    Gestational diabetes Mother    Hypertension Mother    Hyperlipidemia Mother    Breast cancer Maternal Aunt    Heart attack Maternal Grandmother     Social History   Tobacco Use   Smoking status: Never Smoker   Smokeless tobacco: Never Used  Vaping Use   Vaping Use: Never used  Substance Use Topics   Alcohol use: No   Drug use: No    Home Medications Prior to Admission medications   Medication Sig Start Date End Date Taking? Authorizing Provider  BENZEPRO FOAMING CLOTHS 6 % MISC at bedtime.  11/30/18   [provider]  doxycycline (VIBRA-TABS) 100 MG tablet Take 1 tablet (100 mg total) by mouth 2 (two) times daily. 06/17/19   Mcarthur Rossetti, MD  gabapentin (NEURONTIN) 300 MG capsule TAKE 1 CAPSULE IN THE MORNING, 1 CAPSULE IN THE PM AND 2 CAPSULES AT BEDTIME. Patient not taking: Reported on 05/15/2019 12/31/18   Sater, Nanine Means, MD  HYDROcodone-acetaminophen (NORCO/VICODIN) 5-325 MG tablet Take 1-2 tablets by mouth every 6 (six) hours as needed for moderate pain. 06/06/19   Mcarthur Rossetti, MD  Minocycline HCl 135 MG TB24  09/20/18   [provider]  norethindrone (AYGESTIN) 5 MG tablet  05/16/16   [provider]  Oxcarbazepine (TRILEPTAL) 300 MG tablet Take 1 tablet (300 mg total) by mouth 2 (two) times daily. Patient not taking: Reported on 05/15/2019 12/19/18   Sater, Nanine Means, MD  spironolactone (ALDACTONE) 50 MG tablet Take 50 mg by mouth daily.    [provider]  tazarotene (AVAGE) 0.1 % cream Apply topically at bedtime.  04/01/19   [provider]  UNABLE TO FIND Med Name: 10% benzyl peroxide face wash    [provider]    Allergies    Tacrolimus  Review of Systems   Review of Systems  All other systems reviewed and are negative.   Physical Exam Updated Vital Signs BP 108/75 (BP Location:  Right Arm)    Pulse (!) 102    Temp 98.3 F (36.8 C) (Oral)    Resp 18    Ht 5\' 7"  (1.702 m)    Wt 106.1 kg    LMP 11/29/2019    SpO2 100%    BMI 36.65 kg/m   Physical Exam Vitals and nursing note reviewed.  Constitutional:      General: She is not in acute distress.    Appearance: Normal appearance. She is not ill-appearing, toxic-appearing or diaphoretic.  HENT:     Head: Normocephalic and atraumatic.  Pulmonary:     Effort: Pulmonary effort is normal.  Musculoskeletal:     Comments: The right calf appears grossly normal and symmetrical with the left.  There is tenderness to the musculature in the calf.  She has pain with range of motion.  DP pulses are easily palpable and motor and sensation intact throughout the entire foot.  Skin:    General: Skin is warm and dry.  Neurological:     Mental Status: She is alert.     ED Results / Procedures / Treatments   Labs (all labs ordered are listed, but only abnormal results are displayed) Labs Reviewed  BASIC METABOLIC PANEL  CBC WITH DIFFERENTIAL/PLATELET  D-DIMER, QUANTITATIVE (NOT AT [facility])  HCG, SERUM, QUALITATIVE    EKG None  Radiology No results found.  Procedures Procedures (including critical care time)  Medications Ordered in ED Medications  ketorolac (TORADOL) injection 60 mg (has no administration in time range)    ED Course  I have reviewed the triage vital signs and the nursing notes.  Pertinent labs & imaging results that were available during my care of the patient were reviewed by me and considered in my medical decision making (see chart for details).    MDM Rules/Calculators/A&P  Patient with right calf pain that woke her from sleep.  Her presentation and exam is most consistent with a leg cramp.  Laboratory studies were obtained including CBC, basic metabolic, D-dimer, and hCG.  These were all negative including D-dimer, thus I believe ruling out DVT.  There is also no hypokalemia.  Patient given  Toradol and I feel is appropriate for discharge with ibuprofen and Flexeril.  She is to return as needed for any problems.  Final Clinical Impression(s) / ED Diagnoses Final diagnoses:  None    Rx / DC Orders ED Discharge Orders    None       Veryl Speak, MD 12/14/19 [phone]

## 2019-12-14 NOTE — ED Triage Notes (Signed)
Patient presents with complaints of right lower extremity pain; states woke up this am with pain in right lower extremity; states had "2 charlie horses". Patient states pain continued and unable to bear weight due to pain.  Patient drove herself here.

## 2020-03-11 ENCOUNTER — Other Ambulatory Visit: Payer: Self-pay

## 2020-03-11 ENCOUNTER — Emergency Department (HOSPITAL_COMMUNITY): Payer: 59

## 2020-03-11 ENCOUNTER — Emergency Department (HOSPITAL_COMMUNITY)
Admission: EM | Admit: 2020-03-11 | Discharge: 2020-03-11 | Disposition: A | Discharge status: Home / Self Care | Payer: 59 | Attending: Emergency Medicine | Admitting: Emergency Medicine

## 2020-03-11 ENCOUNTER — Encounter (HOSPITAL_COMMUNITY): Payer: Self-pay

## 2020-03-11 DIAGNOSIS — J1282 Pneumonia due to coronavirus disease 2019: Secondary | ICD-10-CM | POA: Insufficient documentation

## 2020-03-11 DIAGNOSIS — U071 COVID-19: Secondary | ICD-10-CM | POA: Diagnosis not present

## 2020-03-11 DIAGNOSIS — R0602 Shortness of breath: Secondary | ICD-10-CM | POA: Diagnosis present

## 2020-03-11 DIAGNOSIS — Z79899 Other long term (current) drug therapy: Secondary | ICD-10-CM | POA: Diagnosis not present

## 2020-03-11 LAB — COMPREHENSIVE METABOLIC PANEL
ALT: 21 U/L (ref 0–44)
AST: 34 U/L (ref 15–41)
Albumin: 4 g/dL (ref 3.5–5.0)
Alkaline Phosphatase: 46 U/L (ref 38–126)
Anion gap: 11 (ref 5–15)
BUN: 11 mg/dL (ref 6–20)
CO2: 23 mmol/L (ref 22–32)
Calcium: 8.7 mg/dL — ABNORMAL LOW (ref 8.9–10.3)
Chloride: 102 mmol/L (ref 98–111)
Creatinine, Ser: 0.92 mg/dL (ref 0.44–1.00)
GFR calc Af Amer: >60 mL/min (ref >60)
GFR calc non Af Amer: >60 mL/min (ref >60)
Glucose, Bld: 103 mg/dL — ABNORMAL HIGH (ref 70–99)
Potassium: 3.7 mmol/L (ref 3.5–5.1)
Sodium: 136 mmol/L (ref 135–145)
Total Bilirubin: 0.8 mg/dL (ref 0.3–1.2)
Total Protein: 7.3 g/dL (ref 6.5–8.1)

## 2020-03-11 LAB — LACTATE DEHYDROGENASE: LDH: 342 U/L — ABNORMAL HIGH (ref 98–192)

## 2020-03-11 LAB — CBC WITH DIFFERENTIAL/PLATELET
Abs Immature Granulocytes: 0.01 10*3/uL (ref 0.00–0.07)
Basophils Absolute: 0 10*3/uL (ref 0.0–0.1)
Basophils Relative: 0 %
Eosinophils Absolute: 0 10*3/uL (ref 0.0–0.5)
Eosinophils Relative: 0 %
HCT: 39.2 % (ref 36.0–46.0)
Hemoglobin: 13.1 g/dL (ref 12.0–15.0)
Immature Granulocytes: 0 %
Lymphocytes Relative: 26 %
Lymphs Abs: 1.1 10*3/uL (ref 0.7–4.0)
MCH: 30.1 pg (ref 26.0–34.0)
MCHC: 33.4 g/dL (ref 30.0–36.0)
MCV: 90.1 fL (ref 80.0–100.0)
Monocytes Absolute: 0.5 10*3/uL (ref 0.1–1.0)
Monocytes Relative: 10 %
Neutro Abs: 2.8 10*3/uL (ref 1.7–7.7)
Neutrophils Relative %: 64 %
Platelets: 224 10*3/uL (ref 150–400)
RBC: 4.35 MIL/uL (ref 3.87–5.11)
RDW: 12.7 % (ref 11.5–15.5)
WBC: 4.4 10*3/uL (ref 4.0–10.5)
nRBC: 0 % (ref 0.0–0.2)

## 2020-03-11 LAB — TRIGLYCERIDES: Triglycerides: 71 mg/dL (ref <150)

## 2020-03-11 LAB — PROCALCITONIN: Procalcitonin: <0.1 ng/mL

## 2020-03-11 LAB — D-DIMER, QUANTITATIVE: D-Dimer, Quant: 0.49 ug/mL-FEU (ref 0.00–0.50)

## 2020-03-11 LAB — LACTIC ACID, PLASMA: Lactic Acid, Venous: 1.1 mmol/L (ref 0.5–1.9)

## 2020-03-11 LAB — I-STAT BETA HCG BLOOD, ED (MC, WL, AP ONLY): I-stat hCG, quantitative: <5 m[IU]/mL (ref <5)

## 2020-03-11 LAB — C-REACTIVE PROTEIN: CRP: 0.7 mg/dL (ref <1.0)

## 2020-03-11 LAB — FIBRINOGEN: Fibrinogen: 413 mg/dL (ref 210–475)

## 2020-03-11 LAB — SARS CORONAVIRUS 2 BY RT PCR (HOSPITAL ORDER, PERFORMED IN ~~LOC~~ HOSPITAL LAB): SARS Coronavirus 2: POSITIVE — AB

## 2020-03-11 LAB — TROPONIN I (HIGH SENSITIVITY): Troponin I (High Sensitivity): 3 ng/L (ref <18)

## 2020-03-11 LAB — FERRITIN: Ferritin: 377 ng/mL — ABNORMAL HIGH (ref 11–307)

## 2020-03-11 MED ORDER — DIPHENHYDRAMINE HCL 50 MG/ML IJ SOLN: 50.0000 mg | Freq: Once | INTRAMUSCULAR | Status: DC | PRN

## 2020-03-11 MED ORDER — METHYLPREDNISOLONE SODIUM SUCC 125 MG IJ SOLR: 125.0000 mg | Freq: Once | INTRAMUSCULAR | Status: DC | PRN

## 2020-03-11 MED ORDER — ALBUTEROL SULFATE HFA 108 (90 BASE) MCG/ACT IN AERS: 2.0000 | INHALATION_SPRAY | Freq: Once | RESPIRATORY_TRACT | Status: DC | PRN

## 2020-03-11 MED ORDER — EPINEPHRINE 0.3 MG/0.3ML IJ SOAJ: 0.3000 mg | Freq: Once | INTRAMUSCULAR | Status: DC | PRN

## 2020-03-11 MED ORDER — ACETAMINOPHEN 325 MG PO TABS
650.0000 mg | ORAL_TABLET | Freq: Once | ORAL | Status: AC
  Administered 2020-03-11: 650 mg via ORAL
  Filled 2020-03-11: qty 2

## 2020-03-11 MED ORDER — FAMOTIDINE IN NACL 20-0.9 MG/50ML-% IV SOLN: 20.0000 mg | Freq: Once | INTRAVENOUS | Status: DC | PRN

## 2020-03-11 MED ORDER — SODIUM CHLORIDE 0.9 % IV SOLN
1200.0000 mg | Freq: Once | INTRAVENOUS | Status: AC
  Administered 2020-03-11: 1200 mg via INTRAVENOUS
  Filled 2020-03-11: qty 10

## 2020-03-11 MED ORDER — SODIUM CHLORIDE 0.9 % IV SOLN: INTRAVENOUS | Status: DC | PRN

## 2020-03-11 MED ORDER — IOHEXOL 350 MG/ML SOLN
100.0000 mL | Freq: Once | INTRAVENOUS | Status: AC | PRN
  Administered 2020-03-11: 100 mL via INTRAVENOUS

## 2020-03-11 NOTE — ED Notes (Signed)
Patient ambulatory to the lobby with a steady gait.,

## 2020-03-11 NOTE — ED Provider Notes (Signed)
[facility] DEPT Provider Note   CSN: [id number] Arrival date & time: 03/11/20  1513     History Chief Complaint  Patient presents with  . Shortness of Breath  . Covid Positive    Michelle Hancock is a 19 y.o. female with PMH of PCOS and positive COVID-19 testing on 03/03/2020 who presents to the ED with complaints of shortness of breath.  Patient reports that she first became symptomatic on 03/02/2020 and was initially okay and working from home, but a few days ago she developed significantly worsening shortness of breath symptoms.  She is also endorsing some chest pain.  States that her chest pain is worse with inspiration and cough.  She also noticed that she became significantly tachycardic, even at rest.  She endorses worsening dyspnea on exertion and has been tachypneic.  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  Patient reports that she has not had an appetite and admits that she has not been eating and drinking well at home.  She denies any history of clots or clotting disorder, unilateral extremity swelling or edema, hemoptysis, dizziness, abdominal pain, nausea or vomiting, or other symptoms.  Patient did not receive the COVID-19 vaccinations.  HPI     Past Medical History:  Diagnosis Date  . Carpal tunnel syndrome    left; surgery planned for December 2020  . Cavernous angioma   . Confluent and reticulated papillomatosis (CARP)   . PCOS (polycystic ovarian syndrome)   . Preauricular tag   . UTI (lower urinary tract infection)     Patient Active Problem List   Diagnosis Date Noted  . Carpal tunnel syndrome on both sides 11/20/2018  . Dysesthesia 11/15/2018  . Numbness on left side 11/15/2018  . 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 Episodic altered awareness 11/15/2018  . Patent pressure equalization (PE) tubes, bilateral 04/11/2016  .  Irregular menstrual cycle 07/11/2014  . Abnormal menstrual periods 07/11/2014  . BMI, pediatric > 99% for age 63/24/2014  . Headache(784.0) 04/26/2013  . Well adolescent visit 04/26/2013  . Persistent cough 02/26/2011  . POSTURAL LIGHTHEADEDNESS 11/20/2007  . COUGH 05/25/2007    Past Surgical History:  Procedure Laterality Date  . MYRINGOTOMY     tonsils  . WISDOM TOOTH EXTRACTION     x4     OB History   No obstetric history on file.     Family History  Problem Relation Age of Onset  . Allergies Father   . Migraines Father   . Gestational diabetes Mother   . Hypertension Mother   . Hyperlipidemia Mother   . Breast cancer Maternal Aunt   . Heart attack Maternal Grandmother     Social History   Tobacco Use  . Smoking status: Never Smoker  . Smokeless tobacco: Never Used  Vaping Use  . Vaping Use: Never used  Substance Use Topics  . Alcohol use: No  . Drug use: No    Home Medications Prior to Admission medications   Medication Sig Start Date End Date Taking? Authorizing Provider  acetaminophen (TYLENOL) 500 MG tablet Take 500 mg by mouth every 6 (six) hours as needed for fever.   Yes [provider]  Multiple Vitamins-Minerals (MULTIVITAMIN ADULTS 50+) TABS Take 1 tablet by mouth daily.   Yes [provider]  cyclobenzaprine (FLEXERIL) 10 MG tablet Take 1 tablet (10 mg total) by mouth 3 (  three) times daily as needed for muscle spasms. Patient not taking: Reported on 03/11/2020 12/14/19   Veryl Speak, MD  doxycycline (VIBRA-TABS) 100 MG tablet Take 1 tablet (100 mg total) by mouth 2 (two) times daily. Patient not taking: Reported on 03/11/2020 06/17/19   Mcarthur Rossetti, MD  gabapentin (NEURONTIN) 300 MG capsule TAKE 1 CAPSULE IN THE MORNING, 1 CAPSULE IN THE PM AND 2 CAPSULES AT BEDTIME. Patient not taking: Reported on 05/15/2019 12/31/18   Sater, Nanine Means, MD  HYDROcodone-acetaminophen (NORCO/VICODIN) 5-325 MG tablet Take 1-2 tablets by  mouth every 6 (six) hours as needed for moderate pain. Patient not taking: Reported on 03/11/2020 06/06/19   Mcarthur Rossetti, MD  Oxcarbazepine (TRILEPTAL) 300 MG tablet Take 1 tablet (300 mg total) by mouth 2 (two) times daily. Patient not taking: Reported on 05/15/2019 12/19/18   Sater, Nanine Means, MD  spironolactone (ALDACTONE) 50 MG tablet Take 50 mg by mouth daily.    [provider]    Allergies    Tacrolimus  Review of Systems   Review of Systems  All other systems reviewed and are negative.   Physical Exam Updated Vital Signs BP 127/75   Pulse 97   Temp 98.6 F (37 C) (Oral)   Resp 20   Ht 5\' 7"  (1.702 m)   Wt 101.3 kg   SpO2 98%   BMI 34.99 kg/m   Physical Exam Vitals and nursing note reviewed. Exam conducted with a chaperone present.  Constitutional:      General: She is not in acute distress.    Appearance: She is obese. She is ill-appearing.  HENT:     Head: Normocephalic and atraumatic.  Eyes:     General: No scleral icterus.    Conjunctiva/sclera: Conjunctivae normal.  Cardiovascular:     Rate and Rhythm: Regular rhythm. Tachycardia present.     Pulses: Normal pulses.     Heart sounds: Normal heart sounds.  Pulmonary:     Comments: Increased respiratory effort.  Patient having to take breaths in between sentences.  Tachypneic in 30s.  No wheezing or stridor noted.  Mild rales noted in bases of the lungs bilaterally.  No distress or accessory muscle use.  Oxygenating well on room air. Abdominal:     General: Abdomen is flat. There is no distension.     Palpations: Abdomen is soft.     Tenderness: There is no abdominal tenderness.  Musculoskeletal:     Cervical back: Normal range of motion.     Right lower leg: No edema.     Left lower leg: No edema.     Comments: No unilateral extremity edema. Moving all extremities. Peripheral pulses intact and symmetric.  Sensation intact throughout.   Skin:    General: Skin is dry.     Capillary  Refill: Capillary refill takes less than 2 seconds.  Neurological:     Mental Status: She is alert and oriented to person, place, and time.     GCS: GCS eye subscore is 4. GCS verbal subscore is 5. GCS motor subscore is 6.  Psychiatric:        Mood and Affect: Mood normal.        Behavior: Behavior normal.        Thought Content: Thought content normal.     ED Results / Procedures / Treatments   Labs (all labs ordered are listed, but only abnormal results are displayed) Labs Reviewed  SARS CORONAVIRUS 2 BY RT PCR [facility]  ORDER, PERFORMED IN [facility]) - Abnormal; Notable for the following components:      Result Value   SARS Coronavirus 2 POSITIVE (*)    All other components within normal limits  COMPREHENSIVE METABOLIC PANEL - Abnormal; Notable for the following components:   Glucose, Bld 103 (*)    Calcium 8.7 (*)    All other components within normal limits  LACTATE DEHYDROGENASE - Abnormal; Notable for the following components:   LDH 342 (*)    All other components within normal limits  FERRITIN - Abnormal; Notable for the following components:   Ferritin 377 (*)    All other components within normal limits  CULTURE, BLOOD (ROUTINE X 2)  CULTURE, BLOOD (ROUTINE X 2)  CBC WITH DIFFERENTIAL/PLATELET  LACTIC ACID, PLASMA  D-DIMER, QUANTITATIVE (NOT AT [facility])  PROCALCITONIN  TRIGLYCERIDES  FIBRINOGEN  C-REACTIVE PROTEIN  I-STAT BETA HCG BLOOD, ED ([facility], [facility], [facility] ONLY)  TROPONIN I (HIGH SENSITIVITY)    EKG EKG Interpretation  Date/Time:  Wednesday March 11 2020 15:28:08 EDT Ventricular Rate:  133 PR Interval:    QRS Duration: 71 QT Interval:  287 QTC Calculation: 418 R Axis:   52 Text Interpretation: Sinus tachycardia Multiple ventricular premature complexes Borderline low voltage, extremity leads Since last tracing Premature ventricular complexes NOW PRESENT Confirmed by Calvert Cantor [phone]) on 03/11/2020 5:02:46 PM   Radiology DG Chest 2  View  Result Date: 03/11/2020 CLINICAL DATA:  SOB, COVID-19+ 8/31 EXAM: CHEST - 2 VIEW COMPARISON:  09/10/2015 FINDINGS: Hazy bibasilar airspace disease. No pleural effusion or pneumothorax. Heart and mediastinal contours are unremarkable. No acute osseous abnormality. IMPRESSION: Hazy bibasilar airspace disease which may reflect atelectasis versus pneumonia. Electronically Signed   By: Kathreen Devoid   On: 03/11/2020 16:03   CT Angio Chest PE W/Cm &/Or Wo Cm  Result Date: 03/11/2020 CLINICAL DATA:  PE suspected, high prob Shortness of breath.  COVID positive 03/03/2020 EXAM: CT ANGIOGRAPHY CHEST WITH CONTRAST TECHNIQUE: Multidetector CT imaging of the chest was performed using the standard protocol during bolus administration of intravenous contrast. Multiplanar CT image reconstructions and MIPs were obtained to evaluate the vascular anatomy. CONTRAST:  150mL OMNIPAQUE IOHEXOL 350 MG/ML SOLN COMPARISON:  Radiograph earlier today. Chest CT 01/01/2016 reviewed. FINDINGS: Cardiovascular: There are no filling defects within the pulmonary arteries to suggest pulmonary embolus. Normal caliber thoracic aorta without dissection. Heart is normal in size. No pericardial effusion. Mediastinum/Nodes: Shotty right hilar nodes measuring 12 mm. No enlarged mediastinal lymph nodes. No suspicious thyroid nodule. No esophageal wall thickening. Lungs/Pleura: Patchy bilateral ground-glass opacities within both lungs. Superimposed consolidation in the dependent left and right lower lobes. Trace left pleural effusion. Trachea and central bronchi are patent. Upper Abdomen: Suspected hepatic steatosis.  No acute findings. Musculoskeletal: There are no acute or suspicious osseous abnormalities. Review of the MIP images confirms the above findings. IMPRESSION: 1. No pulmonary embolus. 2. Patchy bilateral ground-glass opacities within both lungs consistent with COVID-19 pneumonia. Superimposed consolidation in the dependent left and right  lower lobes may be more confluent COVID or superimposed bacterial pneumonia. 3. Trace left pleural effusion. 4. Shotty right hilar nodes are likely reactive. Electronically Signed   By: Keith Rake M.D.   On: 03/11/2020 20:23    Procedures Procedures (including critical care time)  Medications Ordered in ED Medications  0.9 %  sodium chloride infusion (has no administration in time range)  diphenhydrAMINE (BENADRYL) injection 50 mg (has no administration in time range)  famotidine (PEPCID)  IVPB 20 mg premix (has no administration in time range)  methylPREDNISolone sodium succinate (SOLU-MEDROL) 125 mg/2 mL injection 125 mg (has no administration in time range)  albuterol (VENTOLIN HFA) 108 (90 Base) MCG/ACT inhaler 2 puff (has no administration in time range)  EPINEPHrine (EPI-PEN) injection 0.3 mg (has no administration in time range)  acetaminophen (TYLENOL) tablet 650 mg (650 mg Oral Given 03/11/20 1533)  iohexol (OMNIPAQUE) 350 MG/ML injection 100 mL (100 mLs Intravenous Contrast Given 03/11/20 2012)  casirivimab-imdevimab (REGEN-COV) 1,200 mg in sodium chloride 0.9 % 110 mL IVPB (0 mg Intravenous Stopped 03/11/20 2249)    ED Course  I have reviewed the triage vital signs and the nursing notes.  Pertinent labs & imaging results that were available during my care of the patient were reviewed by me and considered in my medical decision making (see chart for details).    MDM Rules/Calculators/A&P                          Patient is 10 days into her course of illness.  Given her abrupt worsening of her condition, will obtain COVID-19 preadmission work-up.  Plan to ambulate patient while pulse oximetry here in the ED to assess for increased work of breathing or hypoxia.  She is saturating well on room air, however does appear to have increased work of breathing and tachypnea into the 30s.  EKG was reviewed and demonstrated sinus tachycardia with frequent PVCs.    DG chest 2 view was  personally reviewed which demonstrates hazy bibasilar airspace disease reflective of atelectasis versus pneumonia.  CTA PE study was obtained and there is no evidence of pulmonary embolism.  Instead, patchy bilateral groundglass opacities within both lungs consistent with COVID-19 pneumonia.  Patient qualifies for MAB infusion given her risk factor of obesity in setting of COVID-19 pneumonia.  She is day 10, will administer infusion here in the ED.    After completion of MAB infusion, will discharge patient home.  On subsequent evaluation, patient feels fatigued, but reasonable for discharge.  Patient's respiratory rate and tachycardia has improved during her duration here in the ED.  She is not in any acute distress.  Discussed case with Dr. Karle Starch who agrees with assessment and plan.  Strict ED return precautions.  She will continue to monitor for hypoxia with her pulse oximeter at home.    All of the evaluation and work-up results were discussed with the patient and any family at bedside.  Patient and/or family were informed that while patient is appropriate for discharge at this time, some medical emergencies may only develop or become detectable after a period of time.  I specifically instructed patient and/or family to return to return to the ED or seek immediate medical attention for any new or worsening symptoms.  They were provided opportunity to ask any additional questions and have none at this time.  Prior to discharge patient is feeling well, agreeable with plan for discharge home.  They have expressed understanding of verbal discharge instructions as well as return precautions and are agreeable to the plan.   Michelle Hancock was evaluated in Emergency Department on 03/11/2020 for the symptoms described in the history of present illness. She was evaluated in the context of the global COVID-19 pandemic, which necessitated consideration that the patient might be at risk for infection with the  SARS-CoV-2 virus that causes COVID-19. Institutional protocols and algorithms that pertain to the evaluation of patients at  risk for COVID-19 are in a state of rapid change based on information released by regulatory bodies including the CDC and federal and state organizations. These policies and algorithms were followed during the patient's care in the ED.   Final Clinical Impression(s) / ED Diagnoses Final diagnoses:  Pneumonia due to COVID-19 virus    Rx / DC Orders ED Discharge Orders    None       Corena Herter, PA-C 03/11/20 2331    Truddie Hidden, MD 03/12/20 1410

## 2020-03-11 NOTE — ED Notes (Signed)
Patient ambulated for about 1 minute, her O2 level went from 100% to 97% and she had to sit on the bed and said that she was too dizzy to continue walking.  She was helped back into the bed.

## 2020-03-11 NOTE — ED Triage Notes (Signed)
Pt arrives today c/o [facility] after being dx with COVID 8/31. Pt states that it has gotten worse, and she feels her heart racing.

## 2020-03-11 NOTE — Discharge Instructions (Addendum)
Please continue to check your temperature at home and take Tylenol ibuprofen as needed for fever control.  I would also like you to continue checking your oxygen saturation using your pulse oximeter at home.  You have been treated with MAB infusions for your COVID-19.  Continue with conservative management at home.  Maintain isolation precautions.  Return to the ED or seek immediate medical attention should you experience any new or worsening symptoms.

## 2020-03-18 LAB — CULTURE, BLOOD (ROUTINE X 2)
Culture: NO GROWTH
Culture: NO GROWTH
Special Requests: ADEQUATE

## 2020-07-09 ENCOUNTER — Ambulatory Visit: Payer: 59 | Admitting: Family Medicine

## 2020-08-24 ENCOUNTER — Encounter: Payer: Self-pay | Admitting: Physician Assistant

## 2020-08-24 ENCOUNTER — Ambulatory Visit: Payer: 59 | Admitting: Physician Assistant

## 2020-08-24 DIAGNOSIS — G5601 Carpal tunnel syndrome, right upper limb: Secondary | ICD-10-CM

## 2020-08-24 NOTE — Progress Notes (Signed)
HPI: Michelle Hancock comes in today for right hand numbness tingling.  She has a history of her left carpal tunnel release 06/06/2019 by Dr. Ninfa Hancock.  In 2020 she did undergo EMG nerve conduction studies which showed moderate carpal tunnel syndrome on the left and mild on the right.  Since then her symptoms have became worse in the right hand.  She is having no numbness tingling left hand.  She states that she does have to shake her hand occasionally due to the numbness tingling in the hand.  She is feels the distribution involves the thumb through the ring finger.  No known injury.  She has had no real treatment.  She has tried wrist splint in the past but did not tolerate this well.  Review of systems see HPI otherwise negative.  Physical exam: Left hand well-healed surgical incision from prior carpal tunnel release.  Sensation grossly intact throughout the left hand negative Tinel's over the median nerve at the wrist and compression test is negative on the left over the median nerve at the wrist.  Positive compression and Tinel's over the right wrist over the median nerve.  Positive Phalen's on the right negative on the left.   Impression: Right hand numbness tingling  Plan: Patient would like to avoid surgery but states she does not want her condition did become as bothersome on the right as it was on the left.  Offered conservative treatment with bracing, Voltaren gel, and steroid injection.  She would like to know the status of her carpal tunnel syndrome on the right therefore we will obtain EMG nerve conduction studies and then have her follow-up with Dr. Ninfa Hancock afterwards.  In the interim she will try bracing again at night and try placing Voltaren gel over the median nerve III to IV times daily.  Questions were encouraged and answered

## 2020-08-24 NOTE — Addendum Note (Signed)
Addended by: Robyne Peers on: 08/24/2020 12:42 PM   Modules accepted: Orders

## 2020-09-23 NOTE — Progress Notes (Signed)
New Patient Note  RE: Michelle Hancock MRN: [id number] DOB: 08/22/2000 Date of Office Visit: 09/24/2020  Referring provider: Lois Huxley, PA Primary care provider: Patient, No Pcp Per  Chief Complaint: Pruritus and Urticaria  History of Present Illness: I had the pleasure of seeing Michelle Hancock for initial evaluation at the [facility] on 09/24/2020. She is a 20 y.o. female, who is self-referred here for the evaluation of cat allergy.   The last few times she came in contact with a cat she developed whole body pruritus and then developed welts after itching her skin. She typically has a very sensitive skin and tends to welt up after itching her skin. She took benadryl with good benefit and symptoms resolved within 1 day.   Denies any rhino conjunctivitis symptoms.   This started happening a few weeks ago with the cat exposure. Anosmia: no. Headache: no. She has used benadryl with fair improvement in symptoms. Sinus infections: no. Previous work up includes: none.  Patient had episodes of itching/rash before this event for the past 2-3 years. Mainly occurs on her torso, scalp. Describes them as itchy, raised. Individual rashes lasts about less than 1 day. No ecchymosis upon resolution. Associated symptoms include: none. Suspected triggers are unknown. Denies any fevers, chills, changes in medications, foods, personal care products or recent infections. She has tried the following therapies: benadryl with good benefit.  Previous work up includes: none.  Pictures consistent with dermatographism.  Assessment and Plan: Michelle Hancock is a 20 y.o. female with: Pruritus History of pruritus with urticaria for the past 2-3 years with no triggers noted. Most recently had extreme pruritus after cat exposure twice. Took benadryl with good benefit. Concerned about allergies. Denies changes in diet, meds or personal care products.   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  Start zyrtec (cetirizine) 10mg  once a day and may take twice a day if needed.   If symptoms not controlled or causes drowsiness let us know. . Avoid the following potential triggers: alcohol, tight clothing, NSAIDs, hot showers and getting overheated. . Avoid cats for now. . Start proper skin care.  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  Other allergic rhinitis Denies significant rhinitis symptoms however noted increased pruritus after cat contact.  Will check environmental allergy panel via blood work.  Return in about 2 months (around 11/24/2020).  Meds ordered this encounter  Medications  . Cetirizine HCl (ZYRTEC ALLERGY) 10 MG CAPS    Sig: Take 1 capsule (10 mg total) by mouth daily.    Dispense:  30 capsule    Refill:  5    Lab Orders     CBC with Differential/Platelet     ANA w/Reflex     Alpha-Gal Panel     Chronic Urticaria     Comprehensive metabolic panel     Sedimentation rate     Thyroid Cascade Profile     Tryptase     Allergens w/Total IgE Area 2  Other allergy screening: Asthma: no Food allergy: patient had some coughing and heavy breathing after eating a cinnamon food once from [employer] but had cinnamon since then with no issues. Medication allergy: yes  Tacrolimus - rash Hymenoptera allergy: no Eczema:no History of recurrent infections suggestive of immunodeficency: no  Diagnostics: None due to significant dermatographism on exam.  Past Medical History: Patient Active Problem List  Diagnosis Date Noted  . 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 Other allergic rhinitis 09/24/2020  . Pruritus 09/24/2020  . Urticaria 09/24/2020  . Carpal tunnel syndrome on both sides 11/20/2018  . Dysesthesia 11/15/2018  . Numbness on left side 11/15/2018  . 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 Episodic altered awareness 11/15/2018  . Patent pressure equalization (PE) tubes, bilateral  04/11/2016  . Irregular menstrual cycle 07/11/2014  . Abnormal menstrual periods 07/11/2014  . BMI, pediatric > 99% for age 38/24/2014  . Headache(784.0) 04/26/2013  . Well adolescent visit 04/26/2013  . Persistent cough 02/26/2011  . POSTURAL LIGHTHEADEDNESS 11/20/2007  . COUGH 05/25/2007   Past Medical History:  Diagnosis Date  . Carpal tunnel syndrome    left; surgery planned for December 2020  . Cavernous angioma   . Confluent and reticulated papillomatosis (CARP)   . 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 PCOS (polycystic ovarian syndrome)   . Preauricular tag   . UTI (lower urinary tract infection)    Past Surgical History: Past Surgical History:  Procedure Laterality Date  . MYRINGOTOMY     tonsils  . WISDOM TOOTH EXTRACTION     x4   Medication List:  Current Outpatient Medications  Medication Sig Dispense Refill  . acetaminophen (TYLENOL) 500 MG tablet Take 500 mg by mouth every 6 (six) hours as needed for fever.    . Cetirizine HCl (ZYRTEC ALLERGY) 10 MG CAPS Take 1 capsule (10 mg total) by mouth daily. 30 capsule 5  . Multiple Vitamin (MULTIVITAMIN) capsule Take 1 capsule by mouth daily.    Marland Kitchen doxycycline (VIBRA-TABS) 100 MG tablet Take 1 tablet (100 mg total) by mouth 2 (two) times daily. (Patient not taking: No sig reported) 20 tablet 0  . Multiple Vitamins-Minerals (MULTIVITAMIN ADULTS 50+) TABS Take 1 tablet by mouth daily. (Patient not taking: Reported on 09/24/2020)     No current facility-administered medications for this visit.   Allergies: Allergies  Allergen Reactions  . Tacrolimus Hives   Social History: Social History   Socioeconomic History  . Marital status: Single    Spouse name: Not on file  . Number of children: 0  . Years of education: Not on file  . Highest education level: Not on file  Occupational History  . Occupation: Hydrographic surveyor  Tobacco Use  . Smoking status: Never Smoker  . Smokeless tobacco: Never Used  Vaping Use  . Vaping Use:  Never used  Substance and Sexual Activity  . Alcohol use: No  . Drug use: No  . Sexual activity: Never  Other Topics Concern  . Not on file  Social History Narrative   Lives w/ family (mom, dad, sister)   Caffeine use: none   Pets Dog outside and Cat   hho f 4   [employer]  9th grade    No ets         Social Determinants of Radio broadcast assistant Strain: Not on file  Food Insecurity: Not on file  Transportation Needs: Not on file  Physical Activity: Not on file  Stress: Not on file  Social Connections: Not on file   Lives in a 5+ year old home. Smoking: denies Occupation: Public affairs consultant HistoryFreight forwarder in the house: no Charity fundraiser in the family room: no Carpet in the bedroom: yes Heating: electric Cooling: central Pet: yes 1 dog indoors, 1 cat outdoors  Family History: Family History  Problem Relation Age of Onset  . Allergies Father   . Migraines Father   .  Allergic rhinitis Father   . Gestational diabetes Mother   . Hypertension Mother   . Hyperlipidemia Mother   . Breast cancer Maternal Aunt   . Heart attack Maternal Grandmother    Review of Systems  Constitutional: Negative for appetite change, chills, fever and unexpected weight change.  HENT: Negative for congestion and rhinorrhea.   Eyes: Negative for itching.  Respiratory: Negative for cough, chest tightness, shortness of breath and wheezing.   Cardiovascular: Negative for chest pain.  Gastrointestinal: Negative for abdominal pain.  Genitourinary: Negative for difficulty urinating.  Skin: Positive for rash.  Neurological: Negative for headaches.   Objective: BP 118/70   Pulse 98   Resp 16   Ht 5' 7.5" (1.715 m)   Wt 249 lb 8 oz (113.2 kg)   SpO2 98%   BMI 38.50 kg/m  Body mass index is 38.5 kg/m. Physical Exam Vitals and nursing note reviewed.  Constitutional:      Appearance: Normal appearance. She is well-developed.  HENT:     Head: Normocephalic  and atraumatic.     Right Ear: External ear normal.     Left Ear: External ear normal.     Nose: Nose normal.     Mouth/Throat:     Mouth: Mucous membranes are moist.     Pharynx: Oropharynx is clear.  Eyes:     Conjunctiva/sclera: Conjunctivae normal.  Cardiovascular:     Rate and Rhythm: Normal rate and regular rhythm.     Heart sounds: Normal heart sounds. No murmur heard. No friction rub. No gallop.   Pulmonary:     Effort: Pulmonary effort is normal.     Breath sounds: Normal breath sounds. No wheezing, rhonchi or rales.  Musculoskeletal:     Cervical back: Neck supple.  Skin:    General: Skin is warm.     Findings: Rash present.     Comments: +3 dermatographism  Neurological:     Mental Status: She is alert and oriented to person, place, and time.  Psychiatric:        Behavior: Behavior normal.    The plan was reviewed with the patient/family, and all questions/concerned were addressed.  It was my pleasure to see Michelle Hancock today and participate in her care. Please feel free to contact me with any questions or concerns.  Sincerely,  Rexene Alberts, DO Allergy & Immunology  [facility] office: [place] office: [phone]

## 2020-09-24 ENCOUNTER — Other Ambulatory Visit: Payer: Self-pay

## 2020-09-24 ENCOUNTER — Encounter: Payer: Self-pay | Admitting: Allergy

## 2020-09-24 ENCOUNTER — Ambulatory Visit: Payer: 59 | Admitting: Allergy

## 2020-09-24 VITALS — BP 118/70 | HR 98 | Resp 16 | Ht 67.5 in | Wt 249.5 lb

## 2020-09-24 DIAGNOSIS — L503 Dermatographic urticaria: Secondary | ICD-10-CM | POA: Diagnosis not present

## 2020-09-24 DIAGNOSIS — J3089 Other allergic rhinitis: Secondary | ICD-10-CM | POA: Insufficient documentation

## 2020-09-24 DIAGNOSIS — L509 Urticaria, unspecified: Secondary | ICD-10-CM | POA: Diagnosis not present

## 2020-09-24 DIAGNOSIS — L299 Pruritus, unspecified: Secondary | ICD-10-CM | POA: Diagnosis not present

## 2020-09-24 HISTORY — DX: Dermatographic urticaria: L50.3

## 2020-09-24 MED ORDER — ZYRTEC ALLERGY 10 MG PO CAPS: 10.0000 mg | ORAL_CAPSULE | Freq: Every day | ORAL | 5 refills | Status: DC

## 2020-09-24 NOTE — Patient Instructions (Addendum)
Hives:  You have dermatographism - see handout.  Start zyrtec (cetirizine) 10mg  once a day and may take twice a day if needed.   If hives are not controlled or causes drowsiness let us know. . Avoid the following potential triggers: alcohol, tight clothing, NSAIDs, hot showers and getting overheated. . Avoid cats for now. . Get bloodwork:  o We are ordering labs, so please allow 1-2 weeks for the results to come back. o With the newly implemented Cures Act, the labs might be visible to you at the same time that they become visible to me. However, I will not address the results until all of the results are back, so please be patient.   Follow up in 2 months or sooner if needed.  Skin care recommendations  Bath time: . Always use lukewarm water. AVOID very hot or cold water. Marland Kitchen Keep bathing time to 5-10 minutes. . Do NOT use bubble bath. . Use a mild soap and use just enough to wash the dirty areas. . Do NOT scrub skin vigorously.  . After bathing, pat dry your skin with a towel. Do NOT rub or scrub the skin.  Moisturizers and prescriptions:  . ALWAYS apply moisturizers immediately after bathing (within 3 minutes). This helps to lock-in moisture. . Use the moisturizer several times a day over the whole body. Kermit Balo summer moisturizers include: Aveeno, CeraVe, Cetaphil. Kermit Balo winter moisturizers include: Aquaphor, Vaseline, Cerave, Cetaphil, Eucerin, Vanicream. . When using moisturizers along with medications, the moisturizer should be applied about one hour after applying the medication to prevent diluting effect of the medication or moisturize around where you applied the medications. When not using medications, the moisturizer can be continued twice daily as maintenance.  Laundry and clothing: . Avoid laundry products with added color or perfumes. . Use unscented hypo-allergenic laundry products such as Tide free, Cheer free & gentle, and All free and clear.  . If the skin still  seems dry or sensitive, you can try double-rinsing the clothes. . Avoid tight or scratchy clothing such as wool. . Do not use fabric softeners or dyer sheets.

## 2020-09-24 NOTE — Assessment & Plan Note (Signed)
Denies significant rhinitis symptoms however noted increased pruritus after cat contact.  Will check environmental allergy panel via blood work.

## 2020-09-24 NOTE — Assessment & Plan Note (Signed)
.   See assessment and plan as above. 

## 2020-09-24 NOTE — Assessment & Plan Note (Signed)
History of pruritus with urticaria for the past 2-3 years with no triggers noted. Most recently had extreme pruritus after cat exposure twice. Took benadryl with good benefit. Concerned about allergies. Denies changes in diet, meds or personal care products.   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  Start zyrtec (cetirizine) 10mg  once a day and may take twice a day if needed.   If symptoms not controlled or causes drowsiness let us know. . Avoid the following potential triggers: alcohol, tight clothing, NSAIDs, hot showers and getting overheated. . Avoid cats for now. . Start proper skin care.

## 2020-10-09 ENCOUNTER — Encounter: Payer: Self-pay | Admitting: Physical Medicine and Rehabilitation

## 2020-10-09 ENCOUNTER — Ambulatory Visit (INDEPENDENT_AMBULATORY_CARE_PROVIDER_SITE_OTHER): Payer: 59 | Admitting: Physical Medicine and Rehabilitation

## 2020-10-09 ENCOUNTER — Other Ambulatory Visit: Payer: Self-pay

## 2020-10-09 DIAGNOSIS — R202 Paresthesia of skin: Secondary | ICD-10-CM | POA: Diagnosis not present

## 2020-10-09 NOTE — Progress Notes (Signed)
Right hand tingling and numbness that comes and goes. States that previous nerve study showed mild on right and severe on left. Had surgery on left. Symptoms are worsening on right. Right hand dominant + Lotion Numeric Pain Rating Scale and Functional Assessment Average Pain 8   In the last MONTH (on 0-10 scale) has pain interfered with the following?  1. General activity like being  able to carry out your everyday physical activities such as walking, climbing stairs, carrying groceries, or moving a chair?  Rating(7)

## 2020-10-09 NOTE — Procedures (Signed)
EMG & NCV Findings: Evaluation of the right median (across palm) sensory nerve showed prolonged distal peak latency (Wrist, 4.0 ms).  All remaining nerves (as indicated in the following tables) were within normal limits.    All examined muscles (as indicated in the following table) showed no evidence of electrical instability.    Impression: The above electrodiagnostic study is ABNORMAL and reveals evidence of a mild right median nerve entrapment at the wrist (carpal tunnel syndrome) affecting sensory components. There is no significant electrodiagnostic evidence of any other focal nerve entrapment, brachial plexopathy or cervical radiculopathy.   Recommendations: 1.  Follow-up with referring physician. 2.  Continue current management of symptoms.  ___________________________ Laurence Spates FAAPMR Board Certified, American Board of Physical Medicine and Rehabilitation    Nerve Conduction Studies Anti Sensory Summary Table   Stim Site NR Peak (ms) Norm Peak (ms) P-T Amp (V) Norm P-T Amp Site1 Site2 Delta-P (ms) Dist (cm) Vel (m/s) Norm Vel (m/s)  Right Median Acr Palm Anti Sensory (2nd Digit)  31.2C  Wrist    *4.0 <3.6 23.5 >10 Wrist Palm 2.2 0.0    Palm    1.8 <2.0 30.4         Right Radial Anti Sensory (Base 1st Digit)  31.1C  Wrist    1.8 <3.1 26.7  Wrist Base 1st Digit 1.8 0.0    Right Ulnar Anti Sensory (5th Digit)  31.5C  Wrist    2.9 <3.7 24.0 >15.0 Wrist 5th Digit 2.9 14.0 48 >38   Motor Summary Table   Stim Site NR Onset (ms) Norm Onset (ms) O-P Amp (mV) Norm O-P Amp Site1 Site2 Delta-0 (ms) Dist (cm) Vel (m/s) Norm Vel (m/s)  Right Median Motor (Abd Poll Brev)  31.3C  Wrist    3.7 <4.2 10.8 >5 Elbow Wrist 4.0 22.5 56 >50  Elbow    7.7  8.9         Right Ulnar Motor (Abd Dig Min)  31.4C  Wrist    2.6 <4.2 11.2 >3 B Elbow Wrist 3.7 22.0 59 >53  B Elbow    6.3  10.5  A Elbow B Elbow 1.1 11.0 100 >53  A Elbow    7.4  10.6          EMG   Side Muscle Nerve Root Ins  Act Fibs Psw Amp Dur Poly Recrt Int Fraser Din Comment  Right Abd Poll Brev Median C8-T1 Nml Nml Nml Nml Nml 0 Nml Nml   Right 1stDorInt Ulnar C8-T1 Nml Nml Nml Nml Nml 0 Nml Nml   Right PronatorTeres Median C6-7 Nml Nml Nml Nml Nml 0 Nml Nml     Nerve Conduction Studies Anti Sensory Left/Right Comparison   Stim Site L Lat (ms) R Lat (ms) L-R Lat (ms) L Amp (V) R Amp (V) L-R Amp (%) Site1 Site2 L Vel (m/s) R Vel (m/s) L-R Vel (m/s)  Median Acr Palm Anti Sensory (2nd Digit)  31.2C  Wrist  *4.0   23.5  Wrist Palm     Palm  1.8   30.4        Radial Anti Sensory (Base 1st Digit)  31.1C  Wrist  1.8   26.7  Wrist Base 1st Digit     Ulnar Anti Sensory (5th Digit)  31.5C  Wrist  2.9   24.0  Wrist 5th Digit  48    Motor Left/Right Comparison   Stim Site L Lat (ms) R Lat (ms) L-R Lat (ms) L Amp (mV)  R Amp (mV) L-R Amp (%) Site1 Site2 L Vel (m/s) R Vel (m/s) L-R Vel (m/s)  Median Motor (Abd Poll Brev)  31.3C  Wrist  3.7   10.8  Elbow Wrist  56   Elbow  7.7   8.9        Ulnar Motor (Abd Dig Min)  31.4C  Wrist  2.6   11.2  B Elbow Wrist  59   B Elbow  6.3   10.5  A Elbow B Elbow  100   A Elbow  7.4   10.6           Waveforms:

## 2020-10-12 NOTE — Progress Notes (Signed)
Michelle Hancock - 20 y.o. female MRN [id number]  Date of birth: April 26, 2001  Office Visit Note: Visit Date: 10/09/2020 PCP: Patient, No Pcp Per (Inactive) Referred by: Lois Huxley, PA  Subjective: Chief Complaint  Patient presents with  . Right Hand - Numbness   HPI:  Michelle Hancock is a 20 y.o. female who comes in today at the request of Benita Stabile, PA-C for electrodiagnostic study of the Right upper extremities.  Patient is Right hand dominant. She has a history of her left carpal tunnel release 06/06/2019 by Dr. Ninfa Linden.  In 2020 she did undergo EMG nerve conduction studies  By Richard A. Felecia Shelling, MD, PhD, Charlynn Grimes which showed moderate carpal tunnel syndrome on the left and mild on the right.  She reports 8 out of 10 pain numbness and tingling in the right hand that is intermittent.  She feels like it is very similar to what she had on the left.  She reports the numbness and tingling is somewhat global and nondermatomal.  She denies any frank radicular symptoms.   ROS Otherwise per HPI.  Assessment & Plan: Visit Diagnoses:    ICD-10-CM   1. Paresthesia of skin  R20.2 NCV with EMG (electromyography)    Plan: Impression: The above electrodiagnostic study is ABNORMAL and reveals evidence of a mild right median nerve entrapment at the wrist (carpal tunnel syndrome) affecting sensory components. There is no significant electrodiagnostic evidence of any other focal nerve entrapment, brachial plexopathy or cervical radiculopathy.   Recommendations: 1.  Follow-up with referring physician. 2.  Continue current management of symptoms.  Meds & Orders: No orders of the defined types were placed in this encounter.   Orders Placed This Encounter  Procedures  . NCV with EMG (electromyography)    Follow-up: Return for Benita Stabile, PA-C.   Procedures: No procedures performed  EMG & NCV Findings: Evaluation of the right median (across palm) sensory nerve showed prolonged distal peak latency (Wrist,  4.0 ms).  All remaining nerves (as indicated in the following tables) were within normal limits.    All examined muscles (as indicated in the following table) showed no evidence of electrical instability.    Impression: The above electrodiagnostic study is ABNORMAL and reveals evidence of a mild right median nerve entrapment at the wrist (carpal tunnel syndrome) affecting sensory components. There is no significant electrodiagnostic evidence of any other focal nerve entrapment, brachial plexopathy or cervical radiculopathy.   Recommendations: 1.  Follow-up with referring physician. 2.  Continue current management of symptoms.  ___________________________ Laurence Spates FAAPMR Board Certified, American Board of Physical Medicine and Rehabilitation    Nerve Conduction Studies Anti Sensory Summary Table   Stim Site NR Peak (ms) Norm Peak (ms) P-T Amp (V) Norm P-T Amp Site1 Site2 Delta-P (ms) Dist (cm) Vel (m/s) Norm Vel (m/s)  Right Median Acr Palm Anti Sensory (2nd Digit)  31.2C  Wrist    *4.0 <3.6 23.5 >10 Wrist Palm 2.2 0.0    Palm    1.8 <2.0 30.4         Right Radial Anti Sensory (Base 1st Digit)  31.1C  Wrist    1.8 <3.1 26.7  Wrist Base 1st Digit 1.8 0.0    Right Ulnar Anti Sensory (5th Digit)  31.5C  Wrist    2.9 <3.7 24.0 >15.0 Wrist 5th Digit 2.9 14.0 48 >38   Motor Summary Table   Stim Site NR Onset (ms) Norm Onset (ms) O-P Amp (mV) Norm O-P Amp  Site1 Site2 Delta-0 (ms) Dist (cm) Vel (m/s) Norm Vel (m/s)  Right Median Motor (Abd Poll Brev)  31.3C  Wrist    3.7 <4.2 10.8 >5 Elbow Wrist 4.0 22.5 56 >50  Elbow    7.7  8.9         Right Ulnar Motor (Abd Dig Min)  31.4C  Wrist    2.6 <4.2 11.2 >3 B Elbow Wrist 3.7 22.0 59 >53  B Elbow    6.3  10.5  A Elbow B Elbow 1.1 11.0 100 >53  A Elbow    7.4  10.6          EMG   Side Muscle Nerve Root Ins Act Fibs Psw Amp Dur Poly Recrt Int Fraser Din Comment  Right Abd Poll Brev Median C8-T1 Nml Nml Nml Nml Nml 0 Nml Nml   Right  1stDorInt Ulnar C8-T1 Nml Nml Nml Nml Nml 0 Nml Nml   Right PronatorTeres Median C6-7 Nml Nml Nml Nml Nml 0 Nml Nml     Nerve Conduction Studies Anti Sensory Left/Right Comparison   Stim Site L Lat (ms) R Lat (ms) L-R Lat (ms) L Amp (V) R Amp (V) L-R Amp (%) Site1 Site2 L Vel (m/s) R Vel (m/s) L-R Vel (m/s)  Median Acr Palm Anti Sensory (2nd Digit)  31.2C  Wrist  *4.0   23.5  Wrist Palm     Palm  1.8   30.4        Radial Anti Sensory (Base 1st Digit)  31.1C  Wrist  1.8   26.7  Wrist Base 1st Digit     Ulnar Anti Sensory (5th Digit)  31.5C  Wrist  2.9   24.0  Wrist 5th Digit  48    Motor Left/Right Comparison   Stim Site L Lat (ms) R Lat (ms) L-R Lat (ms) L Amp (mV) R Amp (mV) L-R Amp (%) Site1 Site2 L Vel (m/s) R Vel (m/s) L-R Vel (m/s)  Median Motor (Abd Poll Brev)  31.3C  Wrist  3.7   10.8  Elbow Wrist  56   Elbow  7.7   8.9        Ulnar Motor (Abd Dig Min)  31.4C  Wrist  2.6   11.2  B Elbow Wrist  59   B Elbow  6.3   10.5  A Elbow B Elbow  100   A Elbow  7.4   10.6           Waveforms:             Clinical History: 11/20/2018 Impression/Plan: This EMG/NCV study shows the following: 1.   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 2.   Mild median neuropathy across the right wrist. 3.   There was no evidence of a superimposed left radiculopathy. 4.   The patient was advised to obtain a wrist splint for carpal tunnel to wear at night.  If symptoms persist consider referral to an orthopedic hand surgeon.  Richard A. Felecia Shelling, MD, PhD, Charlynn Grimes     Objective:  VS:  HT:    WT:   BMI:     BP:   HR: bpm  TEMP: ( )  RESP:  Physical Exam Musculoskeletal:        General: No swelling, tenderness or deformity.     Comments: Inspection reveals no atrophy of the bilateral APB or FDI or hand intrinsics. There is no swelling, color changes, allodynia or dystrophic changes. There is 5 out of  5 strength in the bilateral wrist extension, finger abduction and long  finger flexion. There is intact sensation to light touch in all dermatomal and peripheral nerve distributions.  There is a negative Phalen's test bilaterally. There is a negative Hoffmann's test bilaterally.  Skin:    General: Skin is warm and dry.     Findings: No erythema or rash.  Neurological:     General: No focal deficit present.     Mental Status: She is alert and oriented to person, place, and time.     Motor: No weakness or abnormal muscle tone.     Coordination: Coordination normal.  Psychiatric:        Mood and Affect: Mood normal.        Behavior: Behavior normal.      Imaging: No results found.

## 2020-10-20 ENCOUNTER — Encounter: Payer: Self-pay | Admitting: Orthopaedic Surgery

## 2020-10-20 ENCOUNTER — Ambulatory Visit: Payer: 59 | Admitting: Orthopaedic Surgery

## 2020-10-20 DIAGNOSIS — G5601 Carpal tunnel syndrome, right upper limb: Secondary | ICD-10-CM

## 2020-10-20 NOTE — Progress Notes (Signed)
The patient comes in today to go over the nerve studies involving her right upper extremity.  She has a history of mild carpal tunnel syndrome on that side.  She has had a left open carpal tunnel release done a few years ago.  She still has signs and symptoms consistent with carpal tunnel syndrome.  She was really willing to make sure that things were not worsening at all since this is her dominant hand.  On examination of her right hand there is no muscle atrophy at all.  She has good grip and pinch strength with definitely some subjective numbness in the median nerve distribution.  The nerve studies are reviewed and compared to previous nerve conduction studies of the right upper extremity.  He does exhibit just mild carpal tunnel syndrome that has not changed.  At this point a carpal tunnel release is recommended if her symptoms worsen at all.  I would not need to repeat her studies.  At this point we will go on a clinical exam.  Right now this gives her some reassurance she states in terms of her hand is not getting worse.  If her symptoms persist or worsen she will let us know and we would set her up for a right open carpal tunnel release.  All question concerns were answered and addressed.

## 2020-10-22 ENCOUNTER — Ambulatory Visit: Payer: 59 | Admitting: Allergy

## 2020-11-13 LAB — ALLERGENS W/TOTAL IGE AREA 2
Alternaria Alternata IgE: <0.1 kU/L
Aspergillus Fumigatus IgE: <0.1 kU/L
Bermuda Grass IgE: <0.1 kU/L
Cat Dander IgE: <0.1 kU/L
Cedar, Mountain IgE: <0.1 kU/L
Cladosporium Herbarum IgE: <0.1 kU/L
Cockroach, German IgE: 0.24 kU/L — AB
Common Silver Birch IgE: <0.1 kU/L
Cottonwood IgE: <0.1 kU/L
D Farinae IgE: 13.5 kU/L — AB
D Pteronyssinus IgE: 6.14 kU/L — AB
Dog Dander IgE: 0.14 kU/L — AB
Elm, American IgE: <0.1 kU/L
Johnson Grass IgE: <0.1 kU/L
Maple/Box Elder IgE: <0.1 kU/L
Mouse Urine IgE: <0.1 kU/L
Oak, White IgE: <0.1 kU/L
Pecan, Hickory IgE: <0.1 kU/L
Penicillium Chrysogen IgE: <0.1 kU/L
Pigweed, Rough IgE: <0.1 kU/L
Ragweed, Short IgE: <0.1 kU/L
Sheep Sorrel IgE Qn: <0.1 kU/L
Timothy Grass IgE: <0.1 kU/L
White Mulberry IgE: <0.1 kU/L

## 2020-11-13 LAB — CBC WITH DIFFERENTIAL/PLATELET
Basophils Absolute: 0 10*3/uL (ref 0.0–0.2)
Basos: 1 %
EOS (ABSOLUTE): 0.1 10*3/uL (ref 0.0–0.4)
Eos: 3 %
Hematocrit: 41.6 % (ref 34.0–46.6)
Hemoglobin: 13.9 g/dL (ref 11.1–15.9)
Immature Grans (Abs): 0 10*3/uL (ref 0.0–0.1)
Immature Granulocytes: 0 %
Lymphocytes Absolute: 1.2 10*3/uL (ref 0.7–3.1)
Lymphs: 29 %
MCH: 30.2 pg (ref 26.6–33.0)
MCHC: 33.4 g/dL (ref 31.5–35.7)
MCV: 90 fL (ref 79–97)
Monocytes Absolute: 0.3 10*3/uL (ref 0.1–0.9)
Monocytes: 8 %
Neutrophils Absolute: 2.5 10*3/uL (ref 1.4–7.0)
Neutrophils: 59 %
Platelets: 290 10*3/uL (ref 150–450)
RBC: 4.61 x10E6/uL (ref 3.77–5.28)
RDW: 12.9 % (ref 11.7–15.4)
WBC: 4.2 10*3/uL (ref 3.4–10.8)

## 2020-11-13 LAB — ALPHA-GAL PANEL
Allergen Lamb IgE: <0.1 kU/L
Beef IgE: <0.1 kU/L
IgE (Immunoglobulin E), Serum: 137 IU/mL (ref 6–495)
O215-IgE Alpha-Gal: <0.1 kU/L
Pork IgE: <0.1 kU/L

## 2020-11-13 LAB — COMPREHENSIVE METABOLIC PANEL
ALT: 16 IU/L (ref 0–32)
AST: 21 IU/L (ref 0–40)
Albumin/Globulin Ratio: 1.8 (ref 1.2–2.2)
Albumin: 4.6 g/dL (ref 3.9–5.0)
Alkaline Phosphatase: 63 IU/L (ref 44–121)
BUN/Creatinine Ratio: 10 (ref 9–23)
BUN: 8 mg/dL (ref 6–20)
Bilirubin Total: 0.4 mg/dL (ref 0.0–1.2)
CO2: 24 mmol/L (ref 20–29)
Calcium: 9.8 mg/dL (ref 8.7–10.2)
Chloride: 103 mmol/L (ref 96–106)
Creatinine, Ser: 0.84 mg/dL (ref 0.57–1.00)
Globulin, Total: 2.6 g/dL (ref 1.5–4.5)
Glucose: 95 mg/dL (ref 65–99)
Potassium: 4.7 mmol/L (ref 3.5–5.2)
Sodium: 140 mmol/L (ref 134–144)
Total Protein: 7.2 g/dL (ref 6.0–8.5)
eGFR: 101 mL/min/{1.73_m2} (ref >59)

## 2020-11-13 LAB — TRYPTASE: Tryptase: 3.6 ug/L (ref 2.2–13.2)

## 2020-11-13 LAB — ANA W/REFLEX: Anti Nuclear Antibody (ANA): NEGATIVE

## 2020-11-13 LAB — SEDIMENTATION RATE: Sed Rate: 5 mm/hr (ref 0–32)

## 2020-11-13 LAB — THYROID CASCADE PROFILE: TSH: 0.909 u[IU]/mL (ref 0.450–4.500)

## 2020-11-13 LAB — CHRONIC URTICARIA: cu index: 1.1 (ref <10)

## 2020-11-23 NOTE — Progress Notes (Signed)
Follow Up Note  RE: Michelle Hancock MRN: [id number] DOB: 21-Jun-2001 Date of Office Visit: 11/24/2020  Referring provider: No ref. provider found Primary care provider: Patient, No Pcp Per (Inactive)  Chief Complaint: Allergies (No reactions since last visit)  History of Present Illness: I had the pleasure of seeing Michelle Hancock for a follow up visit at the [facility] on 11/24/2020. She is a 20 y.o. female, who is being followed for pruritus, dermatographism and allergic rhinitis. Her previous allergy office visit was on 09/24/2020 with Dr. Maudie Mercury. Today is a regular follow up visit.  Pruritus/rash Takes zyrtec 10mg  daily with good benefit. Skipped 2 days with no flare in symptoms.   Other allergic rhinitis 1 dog at home with no issues. Denies any other symptoms.   Assessment and Plan: Michelle Hancock is a 20 y.o. female with: Urticaria Past history - pruritus with urticaria for the past 2-3 years with no triggers noted. Most recently had extreme pruritus after cat exposure twice. Took benadryl with good benefit.  Interim history - no issues with zyrtec 10mg  daily. Urticaria bloodwork work up all negative.   DECREASE zyrtec (cetirizine) 10mg  1 tablet every other day for 1 month.  If no itching/rash then you can stop zyrtec completely.   If hives are not controlled or causes drowsiness let us know.  If hives return you may restart taking the lowest amount of zyrtec that was controlling your symptoms.  . Avoid the following potential triggers: alcohol, tight clothing, NSAIDs, hot showers and getting overheated.  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  Other allergic rhinitis Past history - Denies significant rhinitis symptoms however noted increased pruritus after cat contact. 1 dog at home. Interim history - 2022 bloodwork positive to dust mites. Borderline to dog and cockroach.   Start environmental control measures.  Use over the counter antihistamines such  as Zyrtec (cetirizine), Claritin (loratadine), Allegra (fexofenadine), or Xyzal (levocetirizine) daily as needed. May switch antihistamines every few months.  Bilateral impacted cerumen  Use over the counter debrox 5-10 drops twice a day for up to 4 days in a row to soften the earwax.    Return if symptoms worsen or fail to improve.  No orders of the defined types were placed in this encounter.  Lab Orders  No laboratory test(s) ordered today    Diagnostics: None.   Medication List:  Current Outpatient Medications  Medication Sig Dispense Refill  . Cetirizine HCl (ZYRTEC ALLERGY) 10 MG CAPS Take 1 capsule (10 mg total) by mouth daily. 30 capsule 5   No current facility-administered medications for this visit.   Allergies: Allergies  Allergen Reactions  . Tacrolimus Hives   I reviewed her past medical history, social history, family history, and environmental history and no significant changes have been reported from her previous visit.  Review of Systems  Constitutional: Negative for appetite change, chills, fever and unexpected weight change.  HENT: Negative for congestion and rhinorrhea.   Eyes: Negative for itching.  Respiratory: Negative for cough, chest tightness, shortness of breath and wheezing.   Cardiovascular: Negative for chest pain.  Gastrointestinal: Negative for abdominal pain.  Genitourinary: Negative for difficulty urinating.  Skin: Negative for rash.  Allergic/Immunologic: Positive for environmental allergies.  Neurological: Negative for headaches.   Objective: BP 116/72 (BP Location: Right Arm, Patient Position: Sitting, Cuff Size: Normal)   Pulse 82   Temp 98.2 F (36.8 C) (Temporal)   Resp 16   Ht 5' 5.5" (  1.664 m)   Wt 250 lb (113.4 kg)   SpO2 100%   BMI 40.97 kg/m  Body mass index is 40.97 kg/m. Physical Exam Vitals and nursing note reviewed.  Constitutional:      Appearance: Normal appearance. She is well-developed.  HENT:     Head:  Normocephalic and atraumatic.     Right Ear: External ear normal. There is impacted cerumen.     Left Ear: External ear normal. There is impacted cerumen.     Nose: Nose normal.     Mouth/Throat:     Mouth: Mucous membranes are moist.     Pharynx: Oropharynx is clear.  Eyes:     Conjunctiva/sclera: Conjunctivae normal.  Cardiovascular:     Rate and Rhythm: Normal rate and regular rhythm.     Heart sounds: Normal heart sounds. No murmur heard. No friction rub. No gallop.   Pulmonary:     Effort: Pulmonary effort is normal.     Breath sounds: Normal breath sounds. No wheezing, rhonchi or rales.  Musculoskeletal:     Cervical back: Neck supple.  Skin:    General: Skin is warm.     Findings: No rash.  Neurological:     Mental Status: She is alert and oriented to person, place, and time.  Psychiatric:        Behavior: Behavior normal.    Previous notes and tests were reviewed. The plan was reviewed with the patient/family, and all questions/concerned were addressed.  It was my pleasure to see Michelle Hancock today and participate in her care. Please feel free to contact me with any questions or concerns.  Sincerely,  Rexene Alberts, DO Allergy & Immunology  [facility] office: [place] office: [phone]

## 2020-11-24 ENCOUNTER — Ambulatory Visit: Payer: 59 | Admitting: Allergy

## 2020-11-24 ENCOUNTER — Encounter: Payer: Self-pay | Admitting: Allergy

## 2020-11-24 ENCOUNTER — Other Ambulatory Visit: Payer: Self-pay

## 2020-11-24 VITALS — BP 116/72 | HR 82 | Temp 98.2°F | Resp 16 | Ht 65.5 in | Wt 250.0 lb

## 2020-11-24 DIAGNOSIS — L503 Dermatographic urticaria: Secondary | ICD-10-CM

## 2020-11-24 DIAGNOSIS — L509 Urticaria, unspecified: Secondary | ICD-10-CM

## 2020-11-24 DIAGNOSIS — H6123 Impacted cerumen, bilateral: Secondary | ICD-10-CM | POA: Diagnosis not present

## 2020-11-24 DIAGNOSIS — J3089 Other allergic rhinitis: Secondary | ICD-10-CM | POA: Diagnosis not present

## 2020-11-24 NOTE — Assessment & Plan Note (Addendum)
Past history - pruritus with urticaria for the past 2-3 years with no triggers noted. Most recently had extreme pruritus after cat exposure twice. Took benadryl with good benefit.  Interim history - no issues with zyrtec 10mg  daily. Urticaria bloodwork work up all negative.   DECREASE zyrtec (cetirizine) 10mg  1 tablet every other day for 1 month.  If no itching/rash then you can stop zyrtec completely.   If hives are not controlled or causes drowsiness let us know.  If hives return you may restart taking the lowest amount of zyrtec that was controlling your symptoms.  . Avoid the following potential triggers: alcohol, tight clothing, NSAIDs, hot showers and getting overheated.

## 2020-11-24 NOTE — Patient Instructions (Addendum)
Hives/dermatographism:  DECREASE zyrtec (cetirizine) 10mg  1 tablet every other day for 1 month.  If no itching/rash then you can stop zyrtec completely.   If hives are not controlled or causes drowsiness let us know.  If hives return you may restart taking the lowest amount of zyrtec that was controlling your symptoms.  . Avoid the following potential triggers: alcohol, tight clothing, NSAIDs, hot showers and getting overheated.  Cerumen:   Use over the counter debrox 5-10 drops twice a day for up to 4 days in a row to soften the earwax.   Follow up as needed.   Control of House Dust Mite Allergen . Dust mite allergens are a common trigger of allergy and asthma symptoms. While they can be found throughout the house, these microscopic creatures thrive in warm, humid environments such as bedding, upholstered furniture and carpeting. . Because so much time is spent in the bedroom, it is essential to reduce mite levels there.  . Encase pillows, mattresses, and box springs in special allergen-proof fabric covers or airtight, zippered plastic covers.  . Bedding should be washed weekly in hot water (130 F) and dried in a hot dryer. Allergen-proof covers are available for comforters and pillows that can't be regularly washed.  Wendee Copp the allergy-proof covers every few months. Minimize clutter in the bedroom. Keep pets out of the bedroom.  Marland Kitchen Keep humidity less than 50% by using a dehumidifier or air conditioning. You can buy a humidity measuring device called a hygrometer to monitor this.  . If possible, replace carpets with hardwood, linoleum, or washable area rugs. If that's not possible, vacuum frequently with a vacuum that has a HEPA filter. . Remove all upholstered furniture and non-washable window drapes from the bedroom. . Remove all non-washable stuffed toys from the bedroom.  Wash stuffed toys weekly.  Pet Allergen Avoidance: . Contrary to popular opinion, there are no "hypoallergenic"  breeds of dogs or cats. That is because people are not allergic to an animal's hair, but to an allergen found in the animal's saliva, dander (dead skin flakes) or urine. Pet allergy symptoms typically occur within minutes. For some people, symptoms can build up and become most severe 8 to 12 hours after contact with the animal. People with severe allergies can experience reactions in public places if dander has been transported on the pet owners' clothing. Marland Kitchen Keeping an animal outdoors is only a partial solution, since homes with pets in the yard still have higher concentrations of animal allergens. . Before getting a pet, ask your allergist to determine if you are allergic to animals. If your pet is already considered part of your family, try to minimize contact and keep the pet out of the bedroom and other rooms where you spend a great deal of time. . As with dust mites, vacuum carpets often or replace carpet with a hardwood floor, tile or linoleum. . High-efficiency particulate air (HEPA) cleaners can reduce allergen levels over time. . While dander and saliva are the source of cat and dog allergens, urine is the source of allergens from rabbits, hamsters, mice and [place] pigs; so ask a non-allergic family member to clean the animal's cage. . If you have a pet allergy, talk to your allergist about the potential for allergy immunotherapy (allergy shots). This strategy can often provide long-term relief. Cockroach Allergen Avoidance Cockroaches are often found in the homes of densely populated urban areas, schools or commercial buildings, but these creatures can lurk almost anywhere. This does  not mean that you have a dirty house or living area. . Block all areas where roaches can enter the home. This includes crevices, wall cracks and windows.  . Cockroaches need water to survive, so fix and seal all leaky faucets and pipes. Have an exterminator go through the house when your family and pets are gone to  eliminate any remaining roaches. Marland Kitchen Keep food in lidded containers and put pet food dishes away after your pets are done eating. Vacuum and sweep the floor after meals, and take out garbage and recyclables. Use lidded garbage containers in the kitchen. Wash dishes immediately after use and clean under stoves, refrigerators or toasters where crumbs can accumulate. Wipe off the stove and other kitchen surfaces and cupboards regularly.  Skin care recommendations  Bath time: . Always use lukewarm water. AVOID very hot or cold water. Marland Kitchen Keep bathing time to 5-10 minutes. . Do NOT use bubble bath. . Use a mild soap and use just enough to wash the dirty areas. . Do NOT scrub skin vigorously.  . After bathing, pat dry your skin with a towel. Do NOT rub or scrub the skin.  Moisturizers and prescriptions:  . ALWAYS apply moisturizers immediately after bathing (within 3 minutes). This helps to lock-in moisture. . Use the moisturizer several times a day over the whole body. Kermit Balo summer moisturizers include: Aveeno, CeraVe, Cetaphil. Kermit Balo winter moisturizers include: Aquaphor, Vaseline, Cerave, Cetaphil, Eucerin, Vanicream. . When using moisturizers along with medications, the moisturizer should be applied about one hour after applying the medication to prevent diluting effect of the medication or moisturize around where you applied the medications. When not using medications, the moisturizer can be continued twice daily as maintenance.  Laundry and clothing: . Avoid laundry products with added color or perfumes. . Use unscented hypo-allergenic laundry products such as Tide free, Cheer free & gentle, and All free and clear.  . If the skin still seems dry or sensitive, you can try double-rinsing the clothes. . Avoid tight or scratchy clothing such as wool. . Do not use fabric softeners or dyer sheets.

## 2020-11-24 NOTE — Assessment & Plan Note (Signed)
   Use over the counter debrox 5-10 drops twice a day for up to 4 days in a row to soften the earwax.  

## 2020-11-24 NOTE — Assessment & Plan Note (Signed)
.   See assessment and plan as above. 

## 2020-11-24 NOTE — Assessment & Plan Note (Signed)
Past history - Denies significant rhinitis symptoms however noted increased pruritus after cat contact. 1 dog at home. Interim history - 2022 bloodwork positive to dust mites. Borderline to dog and cockroach.   Start environmental control measures.  Use over the counter antihistamines such as Zyrtec (cetirizine), Claritin (loratadine), Allegra (fexofenadine), or Xyzal (levocetirizine) daily as needed. May switch antihistamines every few months.

## 2020-12-22 ENCOUNTER — Other Ambulatory Visit: Payer: Self-pay

## 2020-12-22 ENCOUNTER — Ambulatory Visit: Payer: 59 | Admitting: Family Medicine

## 2020-12-22 ENCOUNTER — Encounter: Payer: Self-pay | Admitting: Family Medicine

## 2020-12-22 VITALS — BP 118/70 | HR 94 | Temp 98.1°F | Ht 67.0 in | Wt 243.5 lb

## 2020-12-22 DIAGNOSIS — Z23 Encounter for immunization: Secondary | ICD-10-CM | POA: Diagnosis not present

## 2020-12-22 DIAGNOSIS — R42 Dizziness and giddiness: Secondary | ICD-10-CM

## 2020-12-22 DIAGNOSIS — Z114 Encounter for screening for human immunodeficiency virus [HIV]: Secondary | ICD-10-CM

## 2020-12-22 DIAGNOSIS — Z0000 Encounter for general adult medical examination without abnormal findings: Secondary | ICD-10-CM | POA: Diagnosis not present

## 2020-12-22 DIAGNOSIS — Z1159 Encounter for screening for other viral diseases: Secondary | ICD-10-CM

## 2020-12-22 DIAGNOSIS — E282 Polycystic ovarian syndrome: Secondary | ICD-10-CM | POA: Diagnosis not present

## 2020-12-22 LAB — COMPREHENSIVE METABOLIC PANEL
ALT: 13 U/L (ref 0–35)
AST: 17 U/L (ref 0–37)
Albumin: 4.5 g/dL (ref 3.5–5.2)
Alkaline Phosphatase: 53 U/L (ref 39–117)
BUN: 9 mg/dL (ref 6–23)
CO2: 26 mEq/L (ref 19–32)
Calcium: 10 mg/dL (ref 8.4–10.5)
Chloride: 105 mEq/L (ref 96–112)
Creatinine, Ser: 0.83 mg/dL (ref 0.40–1.20)
GFR: 100.5 mL/min (ref >60.00)
Glucose, Bld: 82 mg/dL (ref 70–99)
Potassium: 4.1 mEq/L (ref 3.5–5.1)
Sodium: 138 mEq/L (ref 135–145)
Total Bilirubin: 0.5 mg/dL (ref 0.2–1.2)
Total Protein: 7.1 g/dL (ref 6.0–8.3)

## 2020-12-22 LAB — HEMOGLOBIN A1C: Hgb A1c MFr Bld: 5.4 % (ref 4.6–6.5)

## 2020-12-22 LAB — LIPID PANEL
Cholesterol: 170 mg/dL (ref 0–200)
HDL: 45.3 mg/dL (ref >39.00)
LDL Cholesterol: 103 mg/dL — ABNORMAL HIGH (ref 0–99)
NonHDL: 124.58
Total CHOL/HDL Ratio: 4
Triglycerides: 107 mg/dL (ref 0.0–149.0)
VLDL: 21.4 mg/dL (ref 0.0–40.0)

## 2020-12-22 LAB — CBC
HCT: 39.6 % (ref 36.0–46.0)
Hemoglobin: 13.3 g/dL (ref 12.0–15.0)
MCHC: 33.6 g/dL (ref 30.0–36.0)
MCV: 89.6 fl (ref 78.0–100.0)
Platelets: 297 10*3/uL (ref 150.0–400.0)
RBC: 4.42 Mil/uL (ref 3.87–5.11)
RDW: 13.7 % (ref 11.5–15.5)
WBC: 4.3 10*3/uL (ref 4.0–10.5)

## 2020-12-22 LAB — TSH: TSH: 0.84 u[IU]/mL (ref 0.35–4.50)

## 2020-12-22 NOTE — Progress Notes (Signed)
Chief Complaint  Patient presents with   New Patient (Initial Visit)    Would like a CPE today if can     Well Woman Michelle Hancock is here for a complete physical.   Her last physical was >1 year ago.  Current diet: in general, eating could be better.  Current exercise: Just joined a gym. Contraception? Sometimes, rx'd by GYN Fatigue out of ordinary? No Seatbelt? Yes  Health Maintenance Pap/HPV- No Tetanus- No HIV screening- No Hep C screening- No  Lightheaded Over the last year, the patient will get random bouts of lightheaded/feeling off.  It lasts around 30 seconds and then resolves on its own.  It happens seemingly known triggers.  Positioning, meals, hydration status, fatigue, stress, headaches, pain, tingling, weakness are not associated.  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     Allergies Allergies  Allergen Reactions   Tacrolimus Hives    Review of Systems: Constitutional:  no unexpected weight changes Eye:  no recent significant change in vision Ear/Nose/Mouth/Throat:  Ears:  no tinnitus or vertigo and no recent change in hearing Nose/Mouth/Throat:  no complaints of nasal congestion, no sore throat Cardiovascular: no chest pain Respiratory:  no cough and no shortness of breath Gastrointestinal:  no abdominal pain, no change in bowel habits GU:  Female: negative for dysuria or pelvic pain Musculoskeletal/Extremities:  no pain of the joints Integumentary  (Skin/Breast):  no abnormal skin lesions reported Neurologic:  no headaches Endocrine:  denies fatigue Hematologic/Lymphatic:  No areas of easy bleeding  Exam BP 118/70   Pulse 94   Temp 98.1 F (36.7 C) (Oral)   Ht 5\' 7"  (1.702 m)   Wt 243 lb 8 oz (110.5 kg)   SpO2 99%   BMI 38.14 kg/m  General:  well developed, well nourished, in no apparent distress Skin:  no significant moles, warts, or growths Head:  no masses, lesions, or tenderness Eyes:  pupils equal and round, sclera anicteric without injection Ears:  canals without lesions, TMs shiny without retraction, no obvious effusion, no erythema Nose:  nares patent, septum midline, mucosa normal, and no drainage or sinus tenderness Throat/Pharynx:  lips and gingiva without lesion; tongue and uvula midline; non-inflamed pharynx; no exudates or postnasal drainage Neck: neck supple without adenopathy, thyromegaly, or masses Lungs:  clear to auscultation, breath sounds equal bilaterally, no respiratory distress Cardio:  regular rate and rhythm, no bruits, no LE edema Abdomen:  abdomen soft, nontender; bowel sounds normal; no masses or organomegaly Genital: Defer to GYN Musculoskeletal:  symmetrical muscle groups noted without atrophy or deformity Extremities:  no clubbing, cyanosis, or edema, no deformities, no skin discoloration Neuro:  gait normal; deep tendon reflexes normal and symmetric Psych: well oriented with normal range of affect and appropriate judgment/insight  Assessment and Plan  Well adult exam - Plan: CBC, Comprehensive metabolic panel, Lipid panel  PCOS (polycystic ovarian syndrome) - Plan: Hemoglobin A1c  Screening  for HIV without presence of risk factors - Plan: HIV Antibody (routine testing w rflx)  Encounter for hepatitis C screening test for low risk patient - Plan: Hepatitis C antibody  Lightheaded - Plan: TSH  Need for Tdap vaccination - Plan: Tdap vaccine greater than or equal to 33yo IM   Well 20  y.o. female. Counseled on diet and exercise. Unsure what to make of the lightheadedness at this time.  Check labs.  If no findings, will refer to neurology. Other orders as above. Follow up in 1 yr or prn. The patient voiced understanding and agreement to the plan.  [place], DO 12/22/20 1:03 PM

## 2020-12-22 NOTE — Patient Instructions (Addendum)
Give [place] 2-3 business days to get the results of your labs back. If labs are normal, I will refer you to the neurology team.   Keep the diet clean and stay active.  Let us know if you need anything.

## 2020-12-23 LAB — HEPATITIS C ANTIBODY
Hepatitis C Ab: NONREACTIVE
SIGNAL TO CUT-OFF: 0.03 (ref <1.00)

## 2020-12-23 LAB — HIV ANTIBODY (ROUTINE TESTING W REFLEX): HIV 1&2 Ab, 4th Generation: NONREACTIVE

## 2021-02-12 ENCOUNTER — Other Ambulatory Visit: Payer: Self-pay

## 2021-02-12 ENCOUNTER — Ambulatory Visit: Payer: 59 | Admitting: Medical

## 2021-02-12 VITALS — BP 120/74 | HR 100 | Resp 18 | Ht 67.0 in | Wt 245.0 lb

## 2021-02-12 DIAGNOSIS — M940 Chondrocostal junction syndrome [Tietze]: Secondary | ICD-10-CM

## 2021-02-12 DIAGNOSIS — R059 Cough, unspecified: Secondary | ICD-10-CM | POA: Diagnosis not present

## 2021-02-12 DIAGNOSIS — R0789 Other chest pain: Secondary | ICD-10-CM

## 2021-02-12 DIAGNOSIS — J029 Acute pharyngitis, unspecified: Secondary | ICD-10-CM | POA: Diagnosis not present

## 2021-02-12 MED ORDER — BENZONATATE 100 MG PO CAPS: 100.0000 mg | ORAL_CAPSULE | Freq: Three times a day (TID) | ORAL | 0 refills | Status: DC | PRN

## 2021-02-12 MED ORDER — AZITHROMYCIN 250 MG PO TABS: ORAL_TABLET | ORAL | 0 refills | Status: AC

## 2021-02-12 MED ORDER — FLUTICASONE PROPIONATE 50 MCG/ACT NA SUSP: 2.0000 | Freq: Every day | NASAL | 1 refills | Status: DC

## 2021-02-12 NOTE — Addendum Note (Signed)
Addended by: Jeronimo Greaves on: 02/12/2021 11:29 AM   Modules accepted: Orders

## 2021-02-12 NOTE — Addendum Note (Signed)
Addended by: Jeronimo Greaves on: 02/12/2021 11:51 AM   Modules accepted: Orders

## 2021-02-12 NOTE — Patient Instructions (Addendum)
Early onset mild cough, mild st and costochondritis.  Ekg showed normal sinus rhythm.aritifact in lead I and lead III)  Send out strep  test pending.(Rapid test not available in office today)  You have done 2 covid test in one day. Early on rapid may be falsely negative. Pcr covid test done today  Pending results of test rx azithromycin antibiotic, benzonatate for cough and can use ibuprofen for chest wall pain.(Not reported pain on coughing).  By exam and o2 sat done think cxr indicated today.  Follow up in 7-10 days or sooner if needed.  Flonase sent in event get nasal congested.   Stay off from work pending pcr test result.

## 2021-02-12 NOTE — Progress Notes (Signed)
Subjective:    Patient ID: Michelle Hancock, female    DOB: Jul 26, 2000, 20 y.o.   MRN: [id number]  HPI Pt just started yesterday with mild cough. Cough is dry. Overnight cough became worse. Throat feels little dry. This morning her throat is sore. Some pain on swallowing. Some chest congestion. Sore chest and when she cough chest hurts. Dry cough yesterday. Today little productive in am.  When takes a deep breath no chest pain. Feels some chest congestion.  Denies history of asthma. No prior use of inhalers.   Pt tested for covid yesterday and was negative. Did 2 at home test yesterday.   Review of Systems  Constitutional:  Negative for chills, fatigue and fever.  HENT:  Positive for sore throat. Negative for congestion, ear pain and hearing loss.   Eyes:  Negative for redness and itching.  Respiratory:  Positive for cough. Negative for shortness of breath and wheezing.   Cardiovascular:  Negative for chest pain, palpitations and leg swelling.       Atypical chest discomfort.   Gastrointestinal:  Negative for abdominal pain.  Musculoskeletal:  Negative for arthralgias, back pain and myalgias.  Skin:  Negative for rash.  Neurological:  Negative for dizziness, seizures, syncope, weakness and headaches.  Hematological:  Negative for adenopathy. Does not bruise/bleed easily.  Psychiatric/Behavioral:  Negative for behavioral problems and confusion.     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   PCOS (polycystic ovarian syndrome)      Social History   Socioeconomic History   Marital status: Single    Spouse name: Not on file   Number of children: 0   Years of education: Not on file   Highest education level: Not on file  Occupational History   Occupation: [employer]  Tobacco Use   Smoking status: Never    Smokeless tobacco: Never  Vaping Use   Vaping Use: Never used  Substance and Sexual Activity   Alcohol use: No   Drug use: No   Sexual activity: Never  Other Topics Concern   Not on file  Social History Narrative   Lives w/ family (mom, dad, sister)   Caffeine use: none   Pets Dog outside and Cat   hho f 4   [employer]  9th grade    No ets         Social Determinants of Radio broadcast assistant Strain: Not on file  Food Insecurity: Not on file  Transportation Needs: Not on file  Physical Activity: Not on file  Stress: Not on file  Social Connections: Not on file  Intimate Partner Violence: Not on file    Past Surgical History:  Procedure Laterality Date   MYRINGOTOMY     tonsils   WISDOM TOOTH EXTRACTION     x4    Family History  Problem Relation Age of Onset   Allergies Father    Migraines Father    Allergic rhinitis Father    Gestational diabetes Mother    Hypertension Mother    Hyperlipidemia Mother    Breast cancer Maternal Aunt    Heart attack Maternal Grandmother     Allergies  Allergen Reactions   Tacrolimus Hives    Current Outpatient Medications on File Prior to Visit  Medication Sig Dispense Refill   minocycline (  MINOCIN) 100 MG capsule Take 100 mg by mouth daily.     No current facility-administered medications on file prior to visit.    BP 120/74   Pulse 100   Resp 18   Ht '5\' 7"'$  (1.702 m)   Wt 245 lb (111.1 kg)   LMP 01/11/2021   SpO2 98%   BMI 38.37 kg/m       Objective:   Physical Exam  General Mental Status- Alert. General Appearance- Not in acute distress.   Skin General: Color- Normal Color. Moisture- Normal Moisture.  Neck Carotid Arteries- Normal color. Moisture- Normal Moisture. No carotid bruits. No JVD.  Chest and Lung Exam Auscultation: Breath Sounds:-Normal.  Cardiovascular Auscultation:Rythm- Regular. Murmurs & Other Heart Sounds:Auscultation of the heart reveals- No  Murmurs.  Abdomen Inspection:-Inspeection Normal. Palpation/Percussion:Note:No mass. Palpation and Percussion of the abdomen reveal- Non Tender, Non Distended + BS, no rebound or guarding.   Neurologic Cranial Nerve exam:- CN III-XII intact(No nystagmus), symmetric smile. Strength:- 5/5 equal and symmetric strength both upper and lower extremities.   Heent- no sinus pressure. No boggy turbinates. Throat not swollen or red.  Lips not swollen.   Skin- no rash.  Anterior thorax- no tenderness to palpation presently.    Assessment & Plan:   Early onset mild cough, mild st and costochondritis.  Ekg showed normal sinus rhythm.   Send out  strep test pending.  You have done 2 covid test in one day. Early on rapid may be falsely negative. Pcr covid test done today  Pending results of test rx azithromycin antibiotic, benzonatate for cough and can use ibuprofen for chest wall pain.(Not reported pain on coughing).  By exam and o2 sat done think cxr indicated today.  Follow up in 7-10 days or sooner if needed.

## 2021-02-14 LAB — CULTURE, GROUP A STREP
MICRO NUMBER:: 12236326
SPECIMEN QUALITY:: ADEQUATE

## 2021-02-15 LAB — SARS-COV-2, NAA 2 DAY TAT

## 2021-02-15 LAB — NOVEL CORONAVIRUS, NAA: SARS-CoV-2, NAA: DETECTED — AB

## 2021-03-06 ENCOUNTER — Other Ambulatory Visit: Payer: Self-pay | Admitting: Medical

## 2021-03-23 ENCOUNTER — Other Ambulatory Visit: Payer: Self-pay | Admitting: Family Medicine

## 2021-07-26 ENCOUNTER — Other Ambulatory Visit: Payer: Self-pay

## 2021-07-26 ENCOUNTER — Encounter (HOSPITAL_BASED_OUTPATIENT_CLINIC_OR_DEPARTMENT_OTHER): Payer: Self-pay | Admitting: Emergency Medicine

## 2021-07-26 DIAGNOSIS — W19XXXA Unspecified fall, initial encounter: Secondary | ICD-10-CM | POA: Insufficient documentation

## 2021-07-26 DIAGNOSIS — M25551 Pain in right hip: Secondary | ICD-10-CM | POA: Insufficient documentation

## 2021-07-26 DIAGNOSIS — Z7951 Long term (current) use of inhaled steroids: Secondary | ICD-10-CM | POA: Diagnosis not present

## 2021-07-26 DIAGNOSIS — S8391XA Sprain of unspecified site of right knee, initial encounter: Secondary | ICD-10-CM | POA: Diagnosis not present

## 2021-07-26 DIAGNOSIS — S8991XA Unspecified injury of right lower leg, initial encounter: Secondary | ICD-10-CM | POA: Diagnosis present

## 2021-07-26 NOTE — ED Triage Notes (Signed)
Pt reports mechanical fall around 9 pm, heard her right knee pop. Now right knee is swollen, tender, and painful. Also c/o right hip pain. Reports she is unable to bear weight or straighten right leg.

## 2021-07-26 NOTE — ED Notes (Signed)
Awaiting Pregnancy test to continue with hip imaging. Triage RN messaged regarding need for pregnancy testing.   -DMG

## 2021-07-27 ENCOUNTER — Emergency Department (HOSPITAL_BASED_OUTPATIENT_CLINIC_OR_DEPARTMENT_OTHER)
Admission: EM | Admit: 2021-07-27 | Discharge: 2021-07-27 | Disposition: A | Discharge status: Home / Self Care | Payer: 59 | Attending: Emergency Medicine | Admitting: Emergency Medicine

## 2021-07-27 ENCOUNTER — Ambulatory Visit: Payer: Self-pay

## 2021-07-27 ENCOUNTER — Ambulatory Visit: Payer: 59 | Admitting: Family Medicine

## 2021-07-27 ENCOUNTER — Emergency Department (HOSPITAL_BASED_OUTPATIENT_CLINIC_OR_DEPARTMENT_OTHER): Payer: 59 | Admitting: Radiology

## 2021-07-27 ENCOUNTER — Encounter: Payer: Self-pay | Admitting: Family Medicine

## 2021-07-27 VITALS — BP 118/70 | Ht 67.0 in | Wt 245.0 lb

## 2021-07-27 DIAGNOSIS — S83001D Unspecified subluxation of right patella, subsequent encounter: Secondary | ICD-10-CM | POA: Insufficient documentation

## 2021-07-27 DIAGNOSIS — M25361 Other instability, right knee: Secondary | ICD-10-CM | POA: Insufficient documentation

## 2021-07-27 DIAGNOSIS — M25851 Other specified joint disorders, right hip: Secondary | ICD-10-CM

## 2021-07-27 DIAGNOSIS — S83001A Unspecified subluxation of right patella, initial encounter: Secondary | ICD-10-CM

## 2021-07-27 DIAGNOSIS — M25561 Pain in right knee: Secondary | ICD-10-CM

## 2021-07-27 DIAGNOSIS — S8391XA Sprain of unspecified site of right knee, initial encounter: Secondary | ICD-10-CM

## 2021-07-27 LAB — PREGNANCY, URINE: Preg Test, Ur: NEGATIVE

## 2021-07-27 MED ORDER — KETOROLAC TROMETHAMINE 30 MG/ML IJ SOLN
30.0000 mg | Freq: Once | INTRAMUSCULAR | Status: AC
  Administered 2021-07-27: 03:00:00 30 mg via INTRAMUSCULAR
  Filled 2021-07-27: qty 1

## 2021-07-27 MED ORDER — IBUPROFEN 600 MG PO TABS: 600.0000 mg | ORAL_TABLET | Freq: Four times a day (QID) | ORAL | 0 refills | Status: DC | PRN

## 2021-07-27 NOTE — Patient Instructions (Signed)
Nice to meet you Please use ice  Please use the ibuprofen  Please use the knee immobilizer if it helps getting around   Please send me a message in [place] with any questions or updates.  Please see me back in 2 weeks.   --Dr. Raeford Razor

## 2021-07-27 NOTE — ED Notes (Signed)
Pt verbalizes understanding of discharge instructions. Opportunity for questioning and answers were provided. Pt discharged from ED to home.   ? ?

## 2021-07-27 NOTE — Assessment & Plan Note (Signed)
Has mild effusion on exam after feeling a pop in the knee.  Seems more consistent with subluxation at this point. -Counseled on home exercise therapy and supportive care. -Counseled on bracing. -Could consider physical therapy or further imaging.

## 2021-07-27 NOTE — ED Provider Notes (Signed)
[facility]ER[facility]ENC[facility] DEPT Provider Note   CSN: [id number] Arrival date & time: 07/26/21  2341     History  Chief Complaint  Patient presents with   Knee Pain    Michelle Hancock is a 21 y.o. female.  HPI     This is a 21 year old female who presents with injury to the right leg.  Patient reports that she fell and heard a pop in her right knee.  She also reports right hip pain.  She states that her knee is painful and she has difficulty bearing weight.  She is concerned she may have dislocated it.  Denies numbness or tingling in the lower extremity.  Denies hitting her head or loss of consciousness  Home Medications Prior to Admission medications   Medication Sig Start Date End Date Taking? Authorizing Provider  ibuprofen (ADVIL) 600 MG tablet Take 1 tablet (600 mg total) by mouth every 6 (six) hours as needed. 07/27/21  Yes Montrae Braithwaite, Barbette Hair, MD  benzonatate (TESSALON) 100 MG capsule Take 1 capsule (100 mg total) by mouth 3 (three) times daily as needed for cough. 02/12/21   Saguier, Percell Miller, PA-C  fluticasone [facility]) 50 MCG/ACT nasal spray SPRAY 2 SPRAYS INTO EACH NOSTRIL EVERY DAY 03/23/21   Shelda Pal, DO  minocycline (MINOCIN) 100 MG capsule Take 100 mg by mouth daily. 12/15/20   [provider]      Allergies    Tacrolimus    Review of Systems   Review of Systems  Musculoskeletal:        Knee pain  All other systems reviewed and are negative.  Physical Exam Updated Vital Signs BP 120/80    Pulse 88    Temp 98.5 F (36.9 C) (Temporal)    Resp 18    SpO2 100%  Physical Exam Vitals and nursing note reviewed.  Constitutional:      Appearance: She is well-developed. She is obese. She is not ill-appearing.  HENT:     Head: Normocephalic and atraumatic.     Nose: Nose normal.     Mouth/Throat:     Mouth: Mucous membranes are moist.  Cardiovascular:     Rate and Rhythm: Normal rate and regular rhythm.  Pulmonary:     Effort:  Pulmonary effort is normal. No respiratory distress.  Abdominal:     Palpations: Abdomen is soft.  Musculoskeletal:     Cervical back: Neck supple.     Comments: Tenderness palpation along the bilateral joint lines of the right knee, active range of motion limited, passive range of motion intact, patella appears in place, patient can fire her quad, neurovascular intact distally with 2+ DP pulse, no obvious deformity at the hip with normal range of motion  Skin:    General: Skin is warm and dry.  Neurological:     Mental Status: She is alert and oriented to person, place, and time.  Psychiatric:        Mood and Affect: Mood normal.    ED Results / Procedures / Treatments   Labs (all labs ordered are listed, but only abnormal results are displayed) Labs Reviewed  PREGNANCY, URINE    EKG None  Radiology DG Knee Complete 4 Views Right  Result Date: 07/27/2021 CLINICAL DATA:  Fall.  Right knee pain. EXAM: RIGHT KNEE - COMPLETE 4+ VIEW COMPARISON:  None. FINDINGS: No evidence of fracture, dislocation, or joint effusion. No evidence of arthropathy or other focal bone abnormality. Soft tissues are unremarkable. IMPRESSION: Negative. Electronically  Signed   By: Anner Crete M.D.   On: 07/27/2021 00:58   DG Hip Unilat W or Wo Pelvis 2-3 Views Right  Result Date: 07/27/2021 CLINICAL DATA:  Fall.  Right hip pain. EXAM: DG HIP (WITH OR WITHOUT PELVIS) 2-3V RIGHT COMPARISON:  None. FINDINGS: There is no evidence of hip fracture or dislocation. There is no evidence of arthropathy or other focal bone abnormality. IMPRESSION: Negative. Electronically Signed   By: Anner Crete M.D.   On: 07/27/2021 00:58    Procedures Procedures    Medications Ordered in ED Medications  ketorolac (TORADOL) 30 MG/ML injection 30 mg (30 mg Intramuscular Given 07/27/21 0230)    ED Course/ Medical Decision Making/ A&P                           Medical Decision Making Amount and/or Complexity of Data  Reviewed Labs: ordered. Radiology: ordered.  Risk Prescription drug management.   This patient presents to the ED for concern of right knee and hip pain, this involves an extensive number of treatment options, and is a complaint that carries with it a high risk of complications and morbidity.  The differential diagnosis includes fracture, sprain, dislocation, contusion  MDM:    This is a 21 year old female who presents with injury to her right leg.  She is nontoxic and vital signs are reassuring.  Exam shows no deformity suggestive of fracture.  She has joint line tenderness which could suggest ligamentous or meniscal injury.  She is able to fire her quad.  Patella appears located and does not have a deformity.  Doubt fracture or dislocation.  X-rays obtained and showed no evidence of acute fracture of the right knee or hip.  Suspect sprain.  Will place in knee immobilizer.  Ice and elevate.  Sports medicine follow-up. (Labs, imaging)  Labs: I Ordered, and personally interpreted labs.  The pertinent results include: None  Imaging Studies ordered: I ordered imaging studies including x-rays hip and knee I independently visualized and interpreted imaging. I agree with the radiologist interpretation  Additional history obtained from patient.  External records from outside source obtained and reviewed including chart review  Critical Interventions: IM Toradol, knee immobilizer  Consultations: I requested consultation with the outpatient sports medicine,  and discussed lab and imaging findings as well as pertinent plan - they recommend: Outpatient follow-up  Cardiac Monitoring: The patient was maintained on a cardiac monitor.  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    Dispense:  30 tablet    Refill:  0    I have reviewed the patients home medicines and have made adjustments as needed  Problem List / ED Course: Problem List Items Addressed This Visit   None Visit Diagnoses     Sprain of right knee, unspecified ligament, initial encounter    -  Primary                   Final Clinical Impression(s) / ED Diagnoses Final diagnoses:  Sprain of right knee, unspecified ligament, initial encounter    Rx / DC Orders ED Discharge Orders          Ordered    ibuprofen (ADVIL) 600 MG tablet  Every 6 hours PRN        07/27/21 0240              Merryl Hacker, MD 07/27/21 [phone]

## 2021-07-27 NOTE — Discharge Instructions (Signed)
You are seen today for right leg injury.  You likely have a knee sprain.  This can sometimes be related to ligamentous or meniscal injury.  Keep knee immobilizer in place.  Use crutches as needed.  Ice and elevate.  Follow-up with sports medicine.

## 2021-07-27 NOTE — Assessment & Plan Note (Signed)
Acutely occurring after her recent injury.  Likely irritated the joint given the mechanism of her injury and acute flexion. -Counseled on home exercise therapy and supportive care. -Could consider physical therapy or further imaging.

## 2021-07-27 NOTE — Progress Notes (Signed)
°  SHAELEY SEGALL - 21 y.o. female MRN [id number]  Date of birth: 27-Nov-2000  SUBJECTIVE:  Including CC & ROS.  No chief complaint on file.   MARAM BENTLY is a 21 y.o. female that is presenting with acute right knee pain.  She had an injury last night with her leg in extension.  She has currently severe right-sided medial knee pain.  No history of similar pain.  Also having right lateral hip pain.  That has improved with the Toradol.  Review of the note from the emergency department from today shows she was provided ibuprofen. Independent review of the right knee x-ray from 9/24 shows no acute changes. Independent review of the right hip x-ray from 1/24 shows no acute changes.  Review of Systems See HPI   HISTORY: Past Medical, Surgical, Social, and Family History Reviewed & Updated per EMR.   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 Normal-appearing quadricep tendon. Normal-appearing medial and lateral joint space. There appears to be hypoechoic change within the medial retinaculum  Summary: Findings most consistent with patellar subluxation  Ultrasound and interpretation by Clearance Coots, MD    ASSESSMENT & PLAN:   Patellar subluxation, right, initial encounter Has mild effusion on exam after feeling a  pop in the knee.  Seems more consistent with subluxation at this point. -Counseled on home exercise therapy and supportive care. -Counseled on bracing. -Could consider physical therapy or further imaging.  Right hip impingement syndrome Acutely occurring after her recent injury.  Likely irritated the joint given the mechanism of her injury and acute flexion. -Counseled on home exercise therapy and supportive care. -Could consider physical therapy or further imaging.

## 2021-08-11 ENCOUNTER — Ambulatory Visit: Payer: 59 | Admitting: Family Medicine

## 2021-08-11 ENCOUNTER — Encounter: Payer: Self-pay | Admitting: Family Medicine

## 2021-08-11 DIAGNOSIS — S83001D Unspecified subluxation of right patella, subsequent encounter: Secondary | ICD-10-CM

## 2021-08-11 NOTE — Assessment & Plan Note (Signed)
Improving.  Almost back to normal -Counseled on home exercise therapy and supportive care. -Could consider physical therapy if needed.

## 2021-08-11 NOTE — Progress Notes (Addendum)
°  Michelle Hancock - 21 y.o. female MRN [id number]  Date of birth: Nov 05, 2000  SUBJECTIVE:  Including CC & ROS.  No chief complaint on file.   Michelle Hancock is a 21 y.o. female that is following up for her right knee pain.  She is doing well.  She is able to get back to her normal walking and going up and down stairs.  She still has pain if there is any pressure on the kneecap.   Review of Systems See HPI   HISTORY: Past Medical, Surgical, Social, and Family History Reviewed & Updated per EMR.   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  Almost back to normal -Counseled on home exercise therapy and supportive care. -Could consider physical therapy if needed.

## 2021-10-06 ENCOUNTER — Telehealth: Payer: Self-pay | Admitting: Family Medicine

## 2021-10-06 ENCOUNTER — Other Ambulatory Visit: Payer: Self-pay | Admitting: Family Medicine

## 2021-10-06 DIAGNOSIS — R42 Dizziness and giddiness: Secondary | ICD-10-CM

## 2021-10-06 DIAGNOSIS — H538 Other visual disturbances: Secondary | ICD-10-CM

## 2021-10-06 NOTE — Telephone Encounter (Signed)
Called the patient and she preferred to go ahead and refer to H[facility] ?Referral entered ?She preferred Michelle Hancock.  ? ?

## 2021-10-06 NOTE — Telephone Encounter (Signed)
Could refer to Neuro if she wishes or come in to be evaluated.  ?

## 2021-10-06 NOTE — Telephone Encounter (Signed)
Pt stated she was advised by Dr.Wendling the next time she has an "episode' to contact our office. She just remembered to do that now. A few days ago she felt dizzy and had blurred vision along with nausea that lasted longer than usual, about half an hour. She would like to know what the next steps should be. Please advise.  ?

## 2021-10-15 NOTE — Telephone Encounter (Signed)
Referral notes say that [facility] Will not see her because she is established with [facility] ?

## 2021-10-15 NOTE — Telephone Encounter (Signed)
Pt called back stating she had not heard back from the Neuro referral. Informed pt that she used to see [facility] and thats why [facility] had not called her, to which the patient stated that she did not want to go to [place]. Please advise.  ?

## 2021-10-15 NOTE — Telephone Encounter (Signed)
If she's established, does not need referral, OK to give them # to call.  ?

## 2021-10-15 NOTE — Telephone Encounter (Signed)
Called the patient informed of PCP instructions. ?She verbalized understanding/had not other questions or concerns. ?She will give them a call. ?

## 2021-10-18 ENCOUNTER — Encounter: Payer: Self-pay | Admitting: Neurology

## 2021-11-04 ENCOUNTER — Other Ambulatory Visit (INDEPENDENT_AMBULATORY_CARE_PROVIDER_SITE_OTHER): Payer: 59

## 2021-11-04 ENCOUNTER — Telehealth: Payer: Self-pay | Admitting: Neurology

## 2021-11-04 ENCOUNTER — Ambulatory Visit: Payer: 59 | Admitting: Neurology

## 2021-11-04 VITALS — BP 121/80 | HR 87 | Ht 67.0 in | Wt 252.4 lb

## 2021-11-04 DIAGNOSIS — R42 Dizziness and giddiness: Secondary | ICD-10-CM

## 2021-11-04 DIAGNOSIS — R6889 Other general symptoms and signs: Secondary | ICD-10-CM

## 2021-11-04 DIAGNOSIS — D1800 Hemangioma unspecified site: Secondary | ICD-10-CM | POA: Diagnosis not present

## 2021-11-04 LAB — TSH: TSH: 1.59 u[IU]/mL (ref 0.35–5.50)

## 2021-11-04 LAB — VITAMIN B12: Vitamin B-12: 504 pg/mL (ref 211–911)

## 2021-11-04 NOTE — Patient Instructions (Signed)
Check MRI of brain with and without contrast ?Check EEG.  If negative, plan for ambulatory EEG ?Check B12 and TSH ?Follow up after testing. ?
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
## 2021-11-04 NOTE — Telephone Encounter (Signed)
Patient called and left a message at lunch stating she was returning a call she just got from the office. ? ?She was seen earlier today. ?

## 2021-11-05 ENCOUNTER — Ambulatory Visit: Payer: 59 | Admitting: Family Medicine

## 2021-11-05 ENCOUNTER — Ambulatory Visit (HOSPITAL_BASED_OUTPATIENT_CLINIC_OR_DEPARTMENT_OTHER)
Admission: RE | Admit: 2021-11-05 | Discharge: 2021-11-05 | Disposition: A | Discharge status: Home / Self Care | Payer: 59 | Source: Ambulatory Visit | Attending: Family Medicine | Admitting: Family Medicine

## 2021-11-05 ENCOUNTER — Encounter: Payer: Self-pay | Admitting: Family Medicine

## 2021-11-05 VITALS — BP 120/84 | Ht 67.0 in | Wt 252.0 lb

## 2021-11-05 DIAGNOSIS — S83001D Unspecified subluxation of right patella, subsequent encounter: Secondary | ICD-10-CM

## 2021-11-05 NOTE — Assessment & Plan Note (Signed)
Acutely occurring.  Pain exacerbated recently with likely recurrent subluxation.  Positive J sign on exam.  Initial injury was in January.  Currently has x-ray imaging has been normal.  Concern for chondral thinning as to the recurrent pain. ?-Counseled on home exercise therapy and supportive care. ?-X-ray. ?-MRI of the right knee to evaluate for patellar instability and chondral pain, and for possible presurgical planning. ?

## 2021-11-05 NOTE — Patient Instructions (Signed)
Good to see you ?Please use ice as needed  ?I will call with the xray results.  ?We will get the MRI at that time [facility] ?Please send me a message in MyChart with any questions or updates.  ?We will schedule a virtual visit after the MRI results are obtained..  ? ?--Dr. Raeford Razor ? ?

## 2021-11-05 NOTE — Progress Notes (Signed)
?  Michelle Hancock - 21 y.o. female MRN [id number]  Date of birth: [birth year] ? ?SUBJECTIVE:  Including CC & ROS.  ?No chief complaint on file. ? ? ?Michelle Hancock is a 21 y.o. female that is presenting with acute right knee pain.  She was at a trampoline park and had subsequent knee pain.  This is similar to the pain that she was experiencing in the right knee.  She felt that her kneecap was going to shift out of position.  Her initial injury was on January 24. ? ? ? ?Review of Systems ?See HPI  ? ?HISTORY: Past Medical, Surgical, Social, and Family History Reviewed & Updated per EMR.   ?Pertinent Historical Findings include: ? ?Past Medical History:  ?Diagnosis Date  ? Carpal tunnel syndrome   ? left; surgery planned for December 2020  ? Cavernous angioma   ? Confluent and reticulated papillomatosis (CARP)   ? 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 PCOS (polycystic ovarian syndrome)   ? ? ?Past Surgical History:  ?Procedure Laterality Date  ? MYRINGOTOMY    ? tonsils  ? WISDOM TOOTH EXTRACTION    ? x4  ? ? ? ?PHYSICAL EXAM:  ?VS: BP 120/84 (BP Location: Left Arm, Patient Position: Sitting)   Ht '5\' 7"'$  (1.702 m)   Wt 252 lb (114.3 kg)   BMI 39.47 kg/m?  ?Physical Exam ?Gen: NAD, alert, cooperative with exam, well-appearing ?MSK:  ?Right knee: ?No effusion. ?Positive J sign. ?Pain with patellar grind ?Neurovascularly intact   ? ? ? ? ?ASSESSMENT & PLAN:  ? ?Patellar subluxation, right, subsequent encounter ?Acutely occurring.  Pain exacerbated recently with likely recurrent subluxation.  Positive J sign on exam.  Initial injury was in January.  Currently has x-ray imaging has been normal.  Concern for chondral thinning as to the recurrent pain. ?-Counseled on home exercise therapy and supportive care. ?-X-ray. ?-MRI of the right knee to evaluate for patellar instability and chondral pain, and for possible presurgical planning. ? ? ? ? ?

## 2021-11-09 ENCOUNTER — Telehealth: Payer: Self-pay | Admitting: Family Medicine

## 2021-11-09 NOTE — Telephone Encounter (Signed)
Informed of results.  ? ?Rosemarie Ax, MD ?[facility] ?11/09/2021, 9:43 AM ? ?

## 2021-11-15 ENCOUNTER — Ambulatory Visit (INDEPENDENT_AMBULATORY_CARE_PROVIDER_SITE_OTHER): Payer: 59

## 2021-11-15 DIAGNOSIS — S83001D Unspecified subluxation of right patella, subsequent encounter: Secondary | ICD-10-CM | POA: Diagnosis not present

## 2021-11-17 ENCOUNTER — Telehealth (INDEPENDENT_AMBULATORY_CARE_PROVIDER_SITE_OTHER): Payer: 59 | Admitting: Family Medicine

## 2021-11-17 ENCOUNTER — Encounter: Payer: Self-pay | Admitting: Family Medicine

## 2021-11-17 VITALS — Ht 67.0 in | Wt 252.0 lb

## 2021-11-17 DIAGNOSIS — M958 Other specified acquired deformities of musculoskeletal system: Secondary | ICD-10-CM | POA: Diagnosis not present

## 2021-11-17 DIAGNOSIS — M25361 Other instability, right knee: Secondary | ICD-10-CM | POA: Diagnosis not present

## 2021-11-17 NOTE — Assessment & Plan Note (Signed)
Acutely appreciated with edema observed on MRI.  6 mm in length. ?-Counseled on home exercise therapy and supportive care. ?-Referral to orthopedist. ?

## 2021-11-17 NOTE — Assessment & Plan Note (Signed)
She reported having subluxation and the TT-TG distance measures 17 mm as well as a mild patella alto. ?-Counseled on home exercise therapy and supportive care. ?-Referral to orthopedist. ?

## 2021-11-17 NOTE — Progress Notes (Signed)
Virtual Visit via Video Note ? ?I connected with Michelle Hancock on 11/17/21 at  1:30 PM EDT by a video enabled telemedicine application and verified that I am speaking with the correct person using two identifiers. ? ?Location: ?Patient: work ?Provider: office ?  ?I discussed the limitations of evaluation and management by telemedicine and the availability of in person appointments. The patient expressed understanding and agreed to proceed. ? ?History of Present Illness: ? ?Ms. Michelle Hancock is a 21 year old female that is following up after the MRI of her right knee.  Her pain has been improving but she still notices a sharp pain when she is getting up from a seated position.  The MRI demonstrated a intermediate grade chondral defect measuring 6 mm in length.  He also showed a mild patella alto with a TT-TG distance of 17 mm. ?  ?Observations/Objective: ? ? ?Assessment and Plan: ? ?Patellar instability of right knee: ?She reported having subluxation and the TT-TG distance measures 17 mm as well as a mild patella alto. ?-Counseled on home exercise therapy and supportive care. ?-Referral to orthopedist. ? ?Osteochondral defect of patella. ?Acutely appreciated with edema observed on MRI.  6 mm in length. ?-Counseled on home exercise therapy and supportive care. ?-Referral to orthopedist. ? ?Follow Up Instructions: ? ?  ?I discussed the assessment and treatment plan with the patient. The patient was provided an opportunity to ask questions and all were answered. The patient agreed with the plan and demonstrated an understanding of the instructions. ?  ?The patient was advised to call back or seek an in-person evaluation if the symptoms worsen or if the condition fails to improve as anticipated. ? ? ? ?Clearance Coots, MD ? ? ?

## 2021-11-20 ENCOUNTER — Other Ambulatory Visit: Payer: 59

## 2021-11-24 ENCOUNTER — Ambulatory Visit: Payer: 59 | Admitting: Neurology

## 2021-11-24 DIAGNOSIS — R42 Dizziness and giddiness: Secondary | ICD-10-CM | POA: Diagnosis not present

## 2021-11-24 DIAGNOSIS — R6889 Other general symptoms and signs: Secondary | ICD-10-CM

## 2021-11-27 ENCOUNTER — Ambulatory Visit
Admission: RE | Admit: 2021-11-27 | Discharge: 2021-11-27 | Disposition: A | Discharge status: Home / Self Care | Payer: 59 | Source: Ambulatory Visit | Attending: Neurology | Admitting: Neurology

## 2021-11-27 DIAGNOSIS — D1800 Hemangioma unspecified site: Secondary | ICD-10-CM

## 2021-11-27 DIAGNOSIS — R6889 Other general symptoms and signs: Secondary | ICD-10-CM

## 2021-11-27 DIAGNOSIS — R42 Dizziness and giddiness: Secondary | ICD-10-CM

## 2021-11-27 MED ORDER — GADOBENATE DIMEGLUMINE 529 MG/ML IV SOLN
20.0000 mL | Freq: Once | INTRAVENOUS | Status: AC | PRN
  Administered 2021-11-27: 20 mL via INTRAVENOUS

## 2021-11-30 NOTE — Procedures (Signed)
ELECTROENCEPHALOGRAM REPORT  Date of Study: 11/24/2021  Patient's Name: Michelle Hancock MRN: [id number] Date of Birth: 2001-01-30   Clinical History: 21 year old female who presents for recurrent transient spells of lightheadedness with blurred vision and nausea.  Medications: TESSALON 100 MG capsule FLONASE 50 MCG/ACT nasal spray ADVIL 600 MG tablet MINOCIN 100 MG capsule  Technical Summary: A multichannel digital EEG recording measured by the international 10-20 system with electrodes applied with paste and impedances below 5000 ohms performed in our laboratory with EKG monitoring in an awake and asleep patient.  Hyperventilation and photic stimulation were performed.  The digital EEG was referentially recorded, reformatted, and digitally filtered in a variety of bipolar and referential montages for optimal display.    Description: The patient is awake and asleep during the recording.  During maximal wakefulness, there is a symmetric, medium voltage 10 Hz posterior dominant rhythm that attenuates with eye opening.  The record is symmetric.  During drowsiness and sleep, there is an increase in theta slowing of the background.  Vertex waves and symmetric sleep spindles were seen.  During hyperventilation, patient endorsed experiencing a habitual spell.  There was no corresponding seizure activity.  Photic stimulation did not elicit any abnormalities.  There were no epileptiform discharges or electrographic seizures seen.    EKG lead was unremarkable.  Impression: This awake and asleep EEG is normal.    Clinical Correlation: Patient exhibited one of her habitual spells that did not reveal an electrographic correlate suggesting these spells are non-epileptic.   Metta Clines, DO

## 2021-12-01 ENCOUNTER — Telehealth: Payer: Self-pay

## 2021-12-01 DIAGNOSIS — R9089 Other abnormal findings on diagnostic imaging of central nervous system: Secondary | ICD-10-CM

## 2021-12-01 NOTE — Telephone Encounter (Signed)
Patient advised over the phone and through [employer].  Referral sent Via  [employer].

## 2021-12-01 NOTE — Telephone Encounter (Signed)
-----   Message from Pieter Partridge, DO sent at 11/30/2021  3:09 PM EDT ----- EEG showed no abnormalities.  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  There are a couple of findings that are usually nonspecific and are normal variant but sometimes may be indicative of elevated spinal fluid pressure in the head.  I would like to refer her to an ophthalmologist as certain findings on eye exam may support this diagnosis.  Pending eye exam results, we may need to consider a spinal tap to measure the spinal fluid pressure.

## 2021-12-29 ENCOUNTER — Ambulatory Visit: Payer: 59 | Admitting: Neurology

## 2022-03-02 ENCOUNTER — Telehealth: Payer: Self-pay | Admitting: Neurology

## 2022-03-02 ENCOUNTER — Encounter: Payer: Self-pay | Admitting: Neurology

## 2022-03-02 NOTE — Telephone Encounter (Signed)
Patient called to report her episodes are happening more frequently in the last week; dizziness, nausea like overheated, lasting up to 5 minutes.

## 2022-03-02 NOTE — Telephone Encounter (Signed)
Called patient and she is sending her journal of symptoms in the My Chart app

## 2022-03-03 ENCOUNTER — Other Ambulatory Visit: Payer: Self-pay

## 2022-03-03 DIAGNOSIS — R42 Dizziness and giddiness: Secondary | ICD-10-CM

## 2022-03-03 DIAGNOSIS — G43809 Other migraine, not intractable, without status migrainosus: Secondary | ICD-10-CM

## 2022-03-03 DIAGNOSIS — R9089 Other abnormal findings on diagnostic imaging of central nervous system: Secondary | ICD-10-CM

## 2022-03-03 MED ORDER — TOPIRAMATE 25 MG PO TABS: ORAL_TABLET | ORAL | 0 refills | Status: DC

## 2022-03-21 ENCOUNTER — Ambulatory Visit: Payer: 59 | Admitting: Neurology
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
## 2022-04-02 ENCOUNTER — Other Ambulatory Visit: Payer: Self-pay | Admitting: Neurology

## 2022-04-02 DIAGNOSIS — R42 Dizziness and giddiness: Secondary | ICD-10-CM

## 2022-04-02 DIAGNOSIS — R9089 Other abnormal findings on diagnostic imaging of central nervous system: Secondary | ICD-10-CM

## 2022-04-02 DIAGNOSIS — G43809 Other migraine, not intractable, without status migrainosus: Secondary | ICD-10-CM

## 2022-04-04 ENCOUNTER — Ambulatory Visit: Payer: 59 | Admitting: Neurology

## 2022-04-04 ENCOUNTER — Encounter: Payer: Self-pay | Admitting: Neurology

## 2022-04-04 VITALS — BP 109/74 | HR 100 | Ht 67.0 in | Wt 255.0 lb

## 2022-04-04 DIAGNOSIS — H538 Other visual disturbances: Secondary | ICD-10-CM

## 2022-04-04 DIAGNOSIS — G932 Benign intracranial hypertension: Secondary | ICD-10-CM | POA: Diagnosis not present

## 2022-04-04 DIAGNOSIS — R519 Headache, unspecified: Secondary | ICD-10-CM | POA: Diagnosis not present

## 2022-04-04 DIAGNOSIS — R42 Dizziness and giddiness: Secondary | ICD-10-CM | POA: Diagnosis not present

## 2022-04-04 NOTE — Patient Instructions (Addendum)
Check lumbar puncture - opening pressure - also cell count, protein, glucose, gram stain and culture Further recommendations pending results. Follow up in 4-5 months.

## 2022-04-22 ENCOUNTER — Encounter: Payer: Self-pay | Admitting: Radiology

## 2022-04-22 ENCOUNTER — Ambulatory Visit
Admission: RE | Admit: 2022-04-22 | Discharge: 2022-04-22 | Disposition: A | Discharge status: Home / Self Care | Payer: 59 | Source: Ambulatory Visit | Attending: Neurology | Admitting: Neurology

## 2022-04-22 ENCOUNTER — Other Ambulatory Visit: Payer: Self-pay | Admitting: Neurology

## 2022-04-22 VITALS — BP 118/56 | HR 79

## 2022-04-22 DIAGNOSIS — H538 Other visual disturbances: Secondary | ICD-10-CM

## 2022-04-22 DIAGNOSIS — G932 Benign intracranial hypertension: Secondary | ICD-10-CM

## 2022-04-22 DIAGNOSIS — R42 Dizziness and giddiness: Secondary | ICD-10-CM

## 2022-04-22 DIAGNOSIS — R404 Transient alteration of awareness: Secondary | ICD-10-CM

## 2022-04-22 NOTE — Discharge Instructions (Signed)

## 2022-04-25 ENCOUNTER — Telehealth: Payer: Self-pay

## 2022-04-25 MED ORDER — ACETAZOLAMIDE ER 500 MG PO CP12: 500.0000 mg | ORAL_CAPSULE | Freq: Two times a day (BID) | ORAL | 0 refills | Status: DC

## 2022-04-25 NOTE — Telephone Encounter (Signed)
-----   Message from Pieter Partridge, DO sent at 04/22/2022  4:10 PM EDT ----- Spinal fluid pressure is elevated.  I would like to start acetazolamide ER '500mg'$  twice daily - she should be aware that the medication may cause numbness and tingling sensation, so not to get worried if she experiences this

## 2022-04-25 NOTE — Telephone Encounter (Signed)
Patient advised of Dr.Jaffe note.  Acetazolamide ER 500 mg BID called into [facility].

## 2022-04-26 LAB — PROTEIN, CSF: Total Protein, CSF: 17 mg/dL (ref 15–45)

## 2022-04-26 LAB — CSF CELL COUNT WITH DIFFERENTIAL
RBC Count, CSF: 0 cells/uL
TOTAL NUCLEATED CELL: 2 cells/uL (ref 0–5)

## 2022-04-26 LAB — CSF CULTURE W GRAM STAIN
MICRO NUMBER:: 14079699
Result:: NO GROWTH
SPECIMEN QUALITY:: ADEQUATE

## 2022-04-26 LAB — GLUCOSE, CSF: Glucose, CSF: 50 mg/dL (ref 40–80)

## 2022-04-28 ENCOUNTER — Telehealth: Payer: Self-pay

## 2022-04-28 NOTE — Telephone Encounter (Signed)
Please see labs from [facility] 04/25/2022 per [facility]

## 2022-05-22 ENCOUNTER — Other Ambulatory Visit: Payer: Self-pay | Admitting: Neurology

## 2022-05-25 ENCOUNTER — Encounter: Payer: Self-pay | Admitting: Allergy

## 2022-05-25 ENCOUNTER — Other Ambulatory Visit: Payer: Self-pay

## 2022-05-25 ENCOUNTER — Ambulatory Visit (INDEPENDENT_AMBULATORY_CARE_PROVIDER_SITE_OTHER): Payer: 59 | Admitting: Allergy

## 2022-05-25 VITALS — BP 112/72 | HR 87 | Temp 98.1°F | Resp 16 | Ht 67.0 in | Wt 250.6 lb

## 2022-05-25 DIAGNOSIS — J3089 Other allergic rhinitis: Secondary | ICD-10-CM | POA: Diagnosis not present

## 2022-05-25 DIAGNOSIS — L509 Urticaria, unspecified: Secondary | ICD-10-CM | POA: Diagnosis not present

## 2022-05-25 MED ORDER — PREDNISONE 10 MG PO TABS: ORAL_TABLET | ORAL | 0 refills | Status: DC

## 2022-05-25 NOTE — Assessment & Plan Note (Addendum)
Past history - Denies significant rhinitis symptoms however noted increased pruritus after cat contact. 1 dog at home. 2022 bloodwork positive to dust mites. Borderline to dog and cockroach.  Interim history - dust still triggers her and last week had a large exposure now having symptoms. Did not take any antihistamines/nasal sprays for this. Denies fevers/chills/sick contacts.  See below for environmental control measures.  Use over the counter antihistamines such as Zyrtec (cetirizine), Claritin (loratadine), Allegra (fexofenadine), or Xyzal (levocetirizine) daily as needed. May take twice a day during allergy flares. May switch antihistamines every few months. Use Nasacort (triamcinolone) nasal spray 1 spray per nostril twice a day as needed for nasal congestion. Sample given. Nasal saline spray (i.e., Simply Saline) or nasal saline lavage (i.e., NeilMed) is recommended as needed and prior to medicated nasal sprays. If no improvement then: Start prednisone taper. Prednisone '10mg'$  tablets - take 2 tablets for 4 days then 1 tablet on day 5.  If you start having fevers let us know - may need antibiotics but currently no indication.  Consider allergy injections for long term control if above medications do not help the symptoms - handout given.

## 2022-05-25 NOTE — Assessment & Plan Note (Signed)
Past history - pruritus with urticaria for the past 2-3 years with no triggers noted. Most recently had extreme pruritus after cat exposure twice. Took benadryl with good benefit.  Interim history - resolved.

## 2022-05-25 NOTE — Progress Notes (Signed)
Follow Up Note  RE: Michelle Hancock MRN: [id number] DOB: 2000-10-04 Date of Office Visit: 05/25/2022  Referring provider: Shelda Pal* Primary care provider: Shelda Pal, DO  Chief Complaint: ALLERGY RHINITIS, Follow-up, and Urticaria  History of Present Illness: I had the pleasure of seeing Michelle Hancock for a follow up visit at the [facility] on 05/25/2022. She is a 21 y.o. female, who is being followed for urticaria, dermatographism, allergic rhinitis. Her previous allergy office visit was on 11/24/2020 with Dr. Maudie Mercury. Today is a new complaint visit of allergy flare .  Allergic rhinitis Patient does have some coughing, rhinitis symptoms with dust exposure.  Patient was at health at work office and opened a cabinet and dust fell on her.   She then started to have coughing, nasal congestion, rhinorrhea. Symptoms eased off the next day but then got worse and still having symptoms 1 week later.  Took some Nyquil at night with some benefit.  Did not take any allergy medications. No nasal sprays.  No sick contacts. No fevers/chills.   Urticaria/dermatographism Resolved.  Patient had CSF fluid taken out last month.    Assessment and Plan: Michelle Hancock is a 21 y.o. female with: Allergic rhinitis flare vs. URI Past history - Denies significant rhinitis symptoms however noted increased pruritus after cat contact. 1 dog at home. 2022 bloodwork positive to dust mites. Borderline to dog and cockroach.  Interim history - dust still triggers her and last week had a large exposure now having symptoms. Did not take any antihistamines/nasal sprays for this. Denies fevers/chills/sick contacts.  See below for environmental control measures.  Use over the counter antihistamines such as Zyrtec (cetirizine), Claritin (loratadine), Allegra (fexofenadine), or Xyzal (levocetirizine) daily as needed. May take twice a day during allergy flares. May switch antihistamines  every few months. Use Nasacort (triamcinolone) nasal spray 1 spray per nostril twice a day as needed for nasal congestion. Sample given. Nasal saline spray (i.e., Simply Saline) or nasal saline lavage (i.e., NeilMed) is recommended as needed and prior to medicated nasal sprays. If no improvement then: Start prednisone taper. Prednisone '10mg'$  tablets - take 2 tablets for 4 days then 1 tablet on day 5.  If you start having fevers let us know - may need antibiotics but currently no indication.  Consider allergy injections for long term control if above medications do not help the symptoms - handout given.   Urticaria Past history - pruritus with urticaria for the past 2-3 years with no triggers noted. Most recently had extreme pruritus after cat exposure twice. Took benadryl with good benefit.  Interim history - resolved.   Return if symptoms worsen or fail to improve.  Meds ordered this encounter  Medications   predniSONE (DELTASONE) 10 MG tablet    Sig: rednisone '10mg'$  tablets - take 2 tablets for 4 days then 1 tablet on day 5.    Dispense:  9 tablet    Refill:  0   Lab Orders  No laboratory test(s) ordered today    Diagnostics: None.   Medication List:  Current Outpatient Medications  Medication Sig Dispense Refill   doxycycline (ADOXA) 100 MG tablet Take 100 mg by mouth daily.     predniSONE (DELTASONE) 10 MG tablet rednisone '10mg'$  tablets - take 2 tablets for 4 days then 1 tablet on day 5. 9 tablet 0   WINLEVI 1 % CREA APPLY 1 AS DIRECTED TO AFFECTED AREA TWICE A DAY APPLY TO FACE AND  KEEP REFRIGERATED     No current facility-administered medications for this visit.   Allergies: Allergies  Allergen Reactions   Tacrolimus Hives   I reviewed her past medical history, social history, family history, and environmental history and no significant changes have been reported from her previous visit.  Review of Systems  Constitutional:  Negative for appetite change, chills, fever  and unexpected weight change.  HENT:  Positive for congestion, rhinorrhea and sore throat.   Eyes:  Negative for itching.  Respiratory:  Positive for cough. Negative for chest tightness, shortness of breath and wheezing.   Gastrointestinal:  Negative for abdominal pain.  Skin:  Negative for rash.  Allergic/Immunologic: Positive for environmental allergies.  Neurological:  Negative for headaches.    Objective: BP 112/72   Pulse 87   Temp 98.1 F (36.7 C) (Temporal)   Resp 16   Ht '5\' 7"'$  (1.702 m)   Wt 250 lb 9.6 oz (113.7 kg)   SpO2 98%   BMI 39.25 kg/m  Body mass index is 39.25 kg/m. Physical Exam Vitals and nursing note reviewed.  Constitutional:      Appearance: Normal appearance. She is well-developed.  HENT:     Head: Normocephalic and atraumatic.     Right Ear: Tympanic membrane and external ear normal.     Left Ear: Tympanic membrane and external ear normal.     Nose: Congestion present.     Mouth/Throat:     Mouth: Mucous membranes are moist.     Pharynx: Oropharynx is clear.  Eyes:     Conjunctiva/sclera: Conjunctivae normal.  Cardiovascular:     Rate and Rhythm: Normal rate and regular rhythm.     Heart sounds: Normal heart sounds. No murmur heard.    No friction rub. No gallop.  Pulmonary:     Effort: Pulmonary effort is normal.     Breath sounds: Normal breath sounds. No wheezing, rhonchi or rales.  Musculoskeletal:     Cervical back: Neck supple.  Skin:    General: Skin is warm.     Findings: No rash.  Neurological:     Mental Status: She is alert and oriented to person, place, and time.  Psychiatric:        Behavior: Behavior normal.    Previous notes and tests were reviewed. The plan was reviewed with the patient/family, and all questions/concerned were addressed.  It was my pleasure to see Michelle Hancock today and participate in her care. Please feel free to contact me with any questions or concerns.  Sincerely,  Rexene Alberts, DO Allergy &  Immunology  [facility] office: [place] office: [phone]

## 2022-05-25 NOTE — Patient Instructions (Addendum)
Dust allergy See below for environmental control measures.  Use over the counter antihistamines such as Zyrtec (cetirizine), Claritin (loratadine), Allegra (fexofenadine), or Xyzal (levocetirizine) daily as needed. May take twice a day during allergy flares. May switch antihistamines every few months. Use Nasacort (triamcinolone) nasal spray 1 spray per nostril twice a day as needed for nasal congestion. Sample given. Nasal saline spray (i.e., Simply Saline) or nasal saline lavage (i.e., NeilMed) is recommended as needed and prior to medicated nasal sprays. If no improvement then: Start prednisone taper. Prednisone '10mg'$  tablets - take 2 tablets for 4 days then 1 tablet on day 5.  If you start having fevers let us know - may need antibiotics but currently no indication.  Consider allergy injections for long term control if above medications do not help the symptoms - handout given.   Follow up as needed.   Control of House Dust Mite Allergen Dust mite allergens are a common trigger of allergy and asthma symptoms. While they can be found throughout the house, these microscopic creatures thrive in warm, humid environments such as bedding, upholstered furniture and carpeting. Because so much time is spent in the bedroom, it is essential to reduce mite levels there.  Encase pillows, mattresses, and box springs in special allergen-proof fabric covers or airtight, zippered plastic covers.  Bedding should be washed weekly in hot water (130 F) and dried in a hot dryer. Allergen-proof covers are available for comforters and pillows that can't be regularly washed.  Wash the allergy-proof covers every few months. Minimize clutter in the bedroom. Keep pets out of the bedroom.  Keep humidity less than 50% by using a dehumidifier or air conditioning. You can buy a humidity measuring device called a hygrometer to monitor this.  If possible, replace carpets with hardwood, linoleum, or washable area rugs. If  that's not possible, vacuum frequently with a vacuum that has a HEPA filter. Remove all upholstered furniture and non-washable window drapes from the bedroom. Remove all non-washable stuffed toys from the bedroom.  Wash stuffed toys weekly.  Pet Allergen Avoidance: Contrary to popular opinion, there are no "hypoallergenic" breeds of dogs or cats. That is because people are not allergic to an animal's hair, but to an allergen found in the animal's saliva, dander (dead skin flakes) or urine. Pet allergy symptoms typically occur within minutes. For some people, symptoms can build up and become most severe 8 to 12 hours after contact with the animal. People with severe allergies can experience reactions in public places if dander has been transported on the pet owners' clothing. Keeping an animal outdoors is only a partial solution, since homes with pets in the yard still have higher concentrations of animal allergens. Before getting a pet, ask your allergist to determine if you are allergic to animals. If your pet is already considered part of your family, try to minimize contact and keep the pet out of the bedroom and other rooms where you spend a great deal of time. As with dust mites, vacuum carpets often or replace carpet with a hardwood floor, tile or linoleum. High-efficiency particulate air (HEPA) cleaners can reduce allergen levels over time. While dander and saliva are the source of cat and dog allergens, urine is the source of allergens from rabbits, hamsters, mice and [place] pigs; so ask a non-allergic family member to clean the animal's cage. If you have a pet allergy, talk to your allergist about the potential for allergy immunotherapy (allergy shots). This strategy can often provide long-term  relief. Cockroach Allergen Avoidance Cockroaches are often found in the homes of densely populated urban areas, schools or commercial buildings, but these creatures can lurk almost anywhere. This does  not mean that you have a dirty house or living area. Block all areas where roaches can enter the home. This includes crevices, wall cracks and windows.  Cockroaches need water to survive, so fix and seal all leaky faucets and pipes. Have an exterminator go through the house when your family and pets are gone to eliminate any remaining roaches. Keep food in lidded containers and put pet food dishes away after your pets are done eating. Vacuum and sweep the floor after meals, and take out garbage and recyclables. Use lidded garbage containers in the kitchen. Wash dishes immediately after use and clean under stoves, refrigerators or toasters where crumbs can accumulate. Wipe off the stove and other kitchen surfaces and cupboards regularly.  Skin care recommendations  Bath time: Always use lukewarm water. AVOID very hot or cold water. Keep bathing time to 5-10 minutes. Do NOT use bubble bath. Use a mild soap and use just enough to wash the dirty areas. Do NOT scrub skin vigorously.  After bathing, pat dry your skin with a towel. Do NOT rub or scrub the skin.  Moisturizers and prescriptions:  ALWAYS apply moisturizers immediately after bathing (within 3 minutes). This helps to lock-in moisture. Use the moisturizer several times a day over the whole body. Good summer moisturizers include: Aveeno, CeraVe, Cetaphil. Good winter moisturizers include: Aquaphor, Vaseline, Cerave, Cetaphil, Eucerin, Vanicream. When using moisturizers along with medications, the moisturizer should be applied about one hour after applying the medication to prevent diluting effect of the medication or moisturize around where you applied the medications. When not using medications, the moisturizer can be continued twice daily as maintenance.  Laundry and clothing: Avoid laundry products with added color or perfumes. Use unscented hypo-allergenic laundry products such as Tide free, Cheer free & gentle, and All free and clear.   If the skin still seems dry or sensitive, you can try double-rinsing the clothes. Avoid tight or scratchy clothing such as wool. Do not use fabric softeners or dyer sheets.

## 2022-06-10 ENCOUNTER — Telehealth: Payer: Self-pay | Admitting: Allergy

## 2022-06-10 NOTE — Telephone Encounter (Signed)
Patient called and stated that she thinks she had a reaction to something. She took Human resources officer and robitussin with no relief. Patient states that she had prednisone last time and it helped. Patients call back number is [phone]

## 2022-06-10 NOTE — Telephone Encounter (Signed)
Went to bed with the fan on high overnight. Patient states that the fan is dusty and she believes that the dust has gotten in her lungs. She is now congested, coughing here and there but can't get anything up, and sinus congestion, and throat irritation. Patient states that it feels like there is a weight on here chest like a blockage. Advised patient to keep fan clean with and patient states that she normally does but hasn't had a chance to. Patient is unsure if it is her allergies acting up due to sleeping under the fan or if it is something else. Also, patient wants to know if this could develop into bronchitis if it doesn't get better soon with the medication she is currently taking. Robitussin DM (this morning), Allegra(yesterday morning), Nasacort nasal spray, and nightquel (last night).

## 2022-06-13 ENCOUNTER — Other Ambulatory Visit: Payer: Self-pay

## 2022-06-13 ENCOUNTER — Ambulatory Visit (INDEPENDENT_AMBULATORY_CARE_PROVIDER_SITE_OTHER): Payer: 59 | Admitting: Family Medicine

## 2022-06-13 ENCOUNTER — Encounter: Payer: Self-pay | Admitting: Family Medicine

## 2022-06-13 VITALS — BP 110/70 | HR 94 | Temp 98.6°F | Resp 16 | Wt 249.8 lb

## 2022-06-13 DIAGNOSIS — Z8619 Personal history of other infectious and parasitic diseases: Secondary | ICD-10-CM

## 2022-06-13 DIAGNOSIS — J3089 Other allergic rhinitis: Secondary | ICD-10-CM | POA: Diagnosis not present

## 2022-06-13 DIAGNOSIS — J019 Acute sinusitis, unspecified: Secondary | ICD-10-CM | POA: Diagnosis not present

## 2022-06-13 DIAGNOSIS — L509 Urticaria, unspecified: Secondary | ICD-10-CM

## 2022-06-13 DIAGNOSIS — B9689 Other specified bacterial agents as the cause of diseases classified elsewhere: Secondary | ICD-10-CM

## 2022-06-13 MED ORDER — AMOXICILLIN-POT CLAVULANATE 875-125 MG PO TABS: 1.0000 | ORAL_TABLET | Freq: Two times a day (BID) | ORAL | 0 refills | Status: DC

## 2022-06-13 MED ORDER — PREDNISONE 10 MG PO TABS: ORAL_TABLET | ORAL | 0 refills | Status: DC

## 2022-06-13 NOTE — Patient Instructions (Addendum)
Acute bacterial sinusitis Begin Augmentin 875 twice a day for the next 10 days Begin prednisone 10 mg tablets. Take 2 tablets once a day for 4 days, then take 1 tablet on the 5th day, then stop.   Continue nasal saline rinses and begin Xhance as listed below  Allergic rhinitis Continue allergen avoidance measures directed toward dust mite, dog, and cockroach as listed below Continue an over-the-counter antihistamine once a day as needed for runny nose or itch Begin Xhance 2 sprays in each nostril twice a day for nasal congestion Consider saline nasal rinses as needed for nasal symptoms. Use this before any medicated nasal sprays for best result Begin Mucinex [phone] mg twice a day for the next few days to thin out mucus Consider allergen immunotherapy if your symptoms are not well-controlled with the treatment plan as listed above Call the clinic if you develop a fever, if your symptoms worsen or do not improve   Hives (urticaria) Well-controlled Take the least amount of medications while remaining hive free Cetirizine (Zyrtec) '10mg'$  twice a day and famotidine (Pepcid) 20 mg twice a day. If no symptoms for 7-14 days then decrease to. Cetirizine (Zyrtec) '10mg'$  twice a day and famotidine (Pepcid) 20 mg once a day.  If no symptoms for 7-14 days then decrease to. Cetirizine (Zyrtec) '10mg'$  twice a day.  If no symptoms for 7-14 days then decrease to. Cetirizine (Zyrtec) '10mg'$  once a day.  May use Benadryl (diphenhydramine) as needed for breakthrough hives       If symptoms return, then step up dosage  Keep a detailed symptom journal including foods eaten, contact with allergens, medications taken, weather changes.   Frequent infections Keep track of infections, antibiotic use, and steroid use We may consider autoimmune workup in the future  Call the clinic if this treatment plan is not working well for you  Follow up in 2 months or sooner if needed.   Control of Dust Mite Allergen Dust  mites play a major role in allergic asthma and rhinitis. They occur in environments with high humidity wherever human skin is found. Dust mites absorb humidity from the atmosphere (ie, they do not drink) and feed on organic matter (including shed human and animal skin). Dust mites are a microscopic type of insect that you cannot see with the naked eye. High levels of dust mites have been detected from mattresses, pillows, carpets, upholstered furniture, bed covers, clothes, soft toys and any woven material. The principal allergen of the dust mite is found in its feces. A gram of dust may contain 1,000 mites and 250,000 fecal particles. Mite antigen is easily measured in the air during house cleaning activities. Dust mites do not bite and do not cause harm to humans, other than by triggering allergies/asthma.  Ways to decrease your exposure to dust mites in your home:  1. Encase mattresses, box springs and pillows with a mite-impermeable barrier or cover  2. Wash sheets, blankets and drapes weekly in hot water (130 F) with detergent and dry them in a dryer on the hot setting.  3. Have the room cleaned frequently with a vacuum cleaner and a damp dust-mop. For carpeting or rugs, vacuuming with a vacuum cleaner equipped with a high-efficiency particulate air (HEPA) filter. The dust mite allergic individual should not be in a room which is being cleaned and should wait 1 hour after cleaning before going into the room.  4. Do not sleep on upholstered furniture (eg, couches).  5. If possible removing  carpeting, upholstered furniture and drapery from the home is ideal. Horizontal blinds should be eliminated in the rooms where the person spends the most time (bedroom, study, television room). Washable vinyl, roller-type shades are optimal.  6. Remove all non-washable stuffed toys from the bedroom. Wash stuffed toys weekly like sheets and blankets above.  7. Reduce indoor humidity to less than 50%.  Inexpensive humidity monitors can be purchased at most hardware stores. Do not use a humidifier as can make the problem worse and are not recommended.  Control of Dog or Cat Allergen Avoidance is the best way to manage a dog or cat allergy. If you have a dog or cat and are allergic to dog or cats, consider removing the dog or cat from the home. If you have a dog or cat but don't want to find it a new home, or if your family wants a pet even though someone in the household is allergic, here are some strategies that may help keep symptoms at bay:  Keep the pet out of your bedroom and restrict it to only a few rooms. Be advised that keeping the dog or cat in only one room will not limit the allergens to that room. Don't pet, hug or kiss the dog or cat; if you do, wash your hands with soap and water. High-efficiency particulate air (HEPA) cleaners run continuously in a bedroom or living room can reduce allergen levels over time. Regular use of a high-efficiency vacuum cleaner or a central vacuum can reduce allergen levels. Giving your dog or cat a bath at least once a week can reduce airborne allergen.  Control of Cockroach Allergen Cockroach allergen has been identified as an important cause of acute attacks of asthma, especially in urban settings.  There are fifty-five species of cockroach that exist in the [place], however only three, the [place], [place] species produce allergen that can affect patients with Asthma.  Allergens can be obtained from fecal particles, egg casings and secretions from cockroaches.    Remove food sources. Reduce access to water. Seal access and entry points. Spray runways with 0.5-1% Diazinon or Chlorpyrifos Blow boric acid power under stoves and refrigerator. Place bait stations (hydramethylnon) at feeding sites.

## 2022-06-13 NOTE — Telephone Encounter (Signed)
Please call patient and see how she is doing.   If not better, Have her schedule an appointment. She can see Lelon Frohlich or Chrissie today for same day appointment.  Thank you.

## 2022-06-13 NOTE — Progress Notes (Signed)
[place] [address] Dept: [phone]  FOLLOW UP NOTE  Patient ID: Michelle Hancock, female    DOB: 2000/12/01  Age: 21 y.o. MRN: [id number] Date of Office Visit: 06/13/2022  Assessment  Chief Complaint: Other (Same day visit. OTC medication isn't working, always tired and congested. Tested Negative for COVID this morning.)  HPI Michelle Hancock is a 21 year old female who presents to the clinic for acute sick visit.  She was last seen in this clinic on 05/25/2022 by Dr. Maudie Mercury for evaluation of urticaria and allergic rhinitis requiring prednisone for resolution of symptoms.  In the interim, she reports that on Thursday she began to experience a sore throat and felt overheated at nighttime.  Friday she began to experience nasal congestion, rhinorrhea, postnasal drainage, pressure around her eyes, dry cough, and sneeze.  She denies fever and sick contacts.  She does report that she took a COVID test on Thursday and then again this morning, both with negative results.  She has tried several over-the-counter cold and flu preparations with no relief of symptoms.  She continues Allegra 180 mg once a day, Nasacort daily, and nasal saline rinses daily.  She reports allergic rhinitis has been moderately well-controlled with 2 separate flares occurring over the last several months.  Her last environmental allergy testing was via blood work in 2022 and was positive to dust mite and borderline to dog and cockroach.  She does not have dust mite covers on her pillows or bedding at this time.  Other than copious cerumen, she denies symptoms involving ears.  She reports that her throat pain is beginning to resolve at this time.  Allergic conjunctivitis is reported as well controlled with no medical intervention.  She does not have a history of asthma and is not using an asthma rescue inhaler at this time.  She does report if history of frequent upper respiratory infections as well as 2-3 episodes of pneumonia  over her lifetime with the last occurring 2 years ago after COVID infection.  Her current medications are listed in the chart.  Drug Allergies:  Allergies  Allergen Reactions   Tacrolimus Hives    Physical Exam: BP 110/70   Pulse 94   Temp 98.6 F (37 C) (Temporal)   Resp 16   Wt 249 lb 12.8 oz (113.3 kg)   SpO2 100%   BMI 39.12 kg/m    Physical Exam Vitals reviewed.  Constitutional:      Appearance: Normal appearance.  HENT:     Head: Normocephalic and atraumatic.     Right Ear: Tympanic membrane normal.     Left Ear: Tympanic membrane normal.     Nose:     Comments: Bilateral nares edematous and pale with thick clear nasal drainage noted.  Pharynx slightly erythematous with no exudate.  Ears normal with bilateral tympanostomy tubes in place.  Eyes normal. Eyes:     Conjunctiva/sclera: Conjunctivae normal.  Cardiovascular:     Rate and Rhythm: Normal rate and regular rhythm.     Heart sounds: Normal heart sounds. No murmur heard. Pulmonary:     Effort: Pulmonary effort is normal.     Breath sounds: Normal breath sounds.     Comments: Lungs clear to auscultation. Musculoskeletal:        General: Normal range of motion.     Cervical back: Normal range of motion and neck supple.  Skin:    General: Skin is warm and dry.  Neurological:  Mental Status: She is alert and oriented to person, place, and time.  Psychiatric:        Mood and Affect: Mood normal.        Behavior: Behavior normal.        Thought Content: Thought content normal.        Judgment: Judgment normal.     Assessment and Plan: 1. Acute bacterial sinusitis   2. Perennial allergic rhinitis   3. Urticaria   4. Frequent infections     Meds ordered this encounter  Medications   amoxicillin-clavulanate (AUGMENTIN) 875-125 MG tablet    Sig: Take 1 tablet by mouth 2 (two) times daily.    Dispense:  20 tablet    Refill:  0   predniSONE (DELTASONE) 10 MG tablet    Sig: rednisone '10mg'$  tablets -  take 2 tablets for 4 days then 1 tablet on day 5.    Dispense:  9 tablet    Refill:  0    Patient Instructions  Acute bacterial sinusitis Begin Augmentin 875 twice a day for the next 10 days Begin prednisone 10 mg tablets. Take 2 tablets once a day for 4 days, then take 1 tablet on the 5th day, then stop.   Continue nasal saline rinses and begin Xhance as listed below  Allergic rhinitis Continue allergen avoidance measures directed toward dust mite, dog, and cockroach as listed below Continue an over-the-counter antihistamine once a day as needed for runny nose or itch Begin Xhance 2 sprays in each nostril twice a day for nasal congestion Consider saline nasal rinses as needed for nasal symptoms. Use this before any medicated nasal sprays for best result Begin Mucinex [phone] mg twice a day for the next few days to thin out mucus Consider allergen immunotherapy if your symptoms are not well-controlled with the treatment plan as listed above Call the clinic if you develop a fever, if your symptoms worsen or do not improve   Hives (urticaria) Well-controlled Take the least amount of medications while remaining hive free Cetirizine (Zyrtec) '10mg'$  twice a day and famotidine (Pepcid) 20 mg twice a day. If no symptoms for 7-14 days then decrease to. Cetirizine (Zyrtec) '10mg'$  twice a day and famotidine (Pepcid) 20 mg once a day.  If no symptoms for 7-14 days then decrease to. Cetirizine (Zyrtec) '10mg'$  twice a day.  If no symptoms for 7-14 days then decrease to. Cetirizine (Zyrtec) '10mg'$  once a day.  May use Benadryl (diphenhydramine) as needed for breakthrough hives       If symptoms return, then step up dosage  Keep a detailed symptom journal including foods eaten, contact with allergens, medications taken, weather changes.   Frequent infections Keep track of infections, antibiotic use, and steroid use We may consider autoimmune workup in the future  Call the clinic if this treatment plan  is not working well for you  Follow up in 2 months or sooner if needed.   Return in about 2 months (around 08/14/2022), or if symptoms worsen or fail to improve.    Thank you for the opportunity to care for this patient.  Please do not hesitate to contact me with questions.  Gareth Morgan, FNP [facility]

## 2022-06-13 NOTE — Telephone Encounter (Signed)
Patient has been scheduled for same day appointment with Webb Silversmith today at 11:30. Patient states that she isn't any better and that she took a COVID test this morning and it was negative.

## 2022-06-24 IMAGING — MR MR KNEE*R* W/O CM
7 series · 40 of 40 positions shown · non-contrast
Comparison: Knee radiograph 11/05/2021

CLINICAL DATA: Fracture, patella right knee pain

EXAM:
MRI OF THE RIGHT KNEE WITHOUT CONTRAST
TECHNIQUE: Multiplanar, multisequence MR imaging of the knee was performed. No
intravenous contrast was administered.

[Series 3: T2 fat-sat · axial · 4.0mm · 0.62mm/px · z∈[-88,+46]mm · 6 of 28 slices shown (1 of 3)]
[im 1/28]
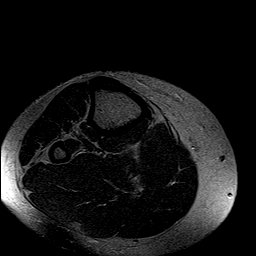
[im 6/28]
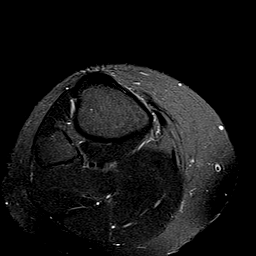
[im 11/28]
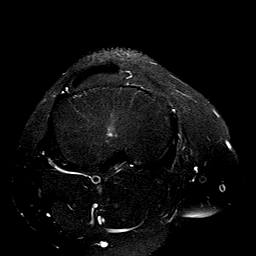
[im 17/28]
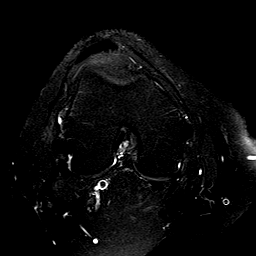
[im 22/28]
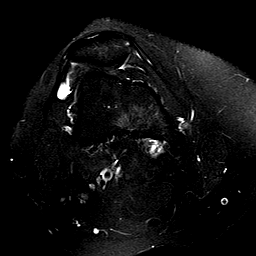
[im 28/28]
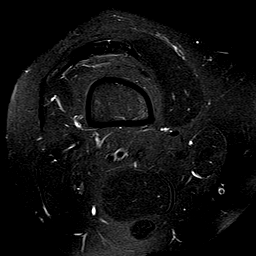

[Series 4: T1 · coronal · 4.0mm · 0.59mm/px · 6 of 28 slices shown]
[im 1/28]
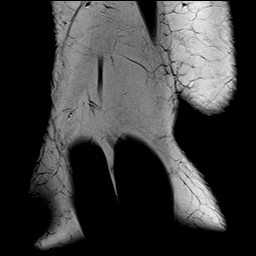
[im 6/28]
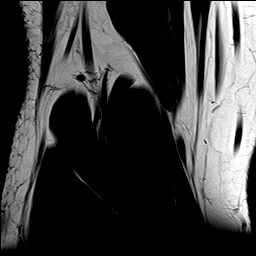
[im 11/28]
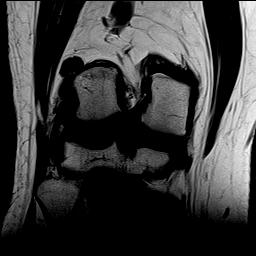
[im 17/28]
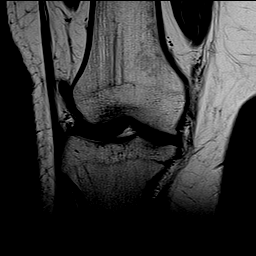
[im 22/28]
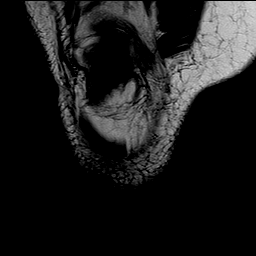
[im 28/28]
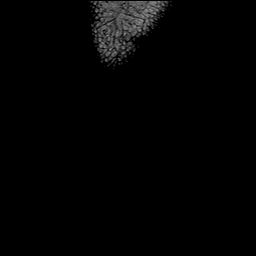

[Series 5: T2 fat-sat · coronal · 4.0mm · 0.59mm/px · 6 of 28 slices shown (2 of 3)]
[im 1/28]
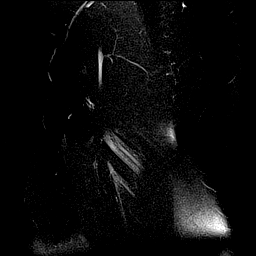
[im 6/28]
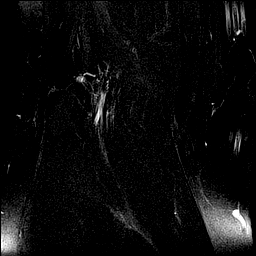
[im 11/28]
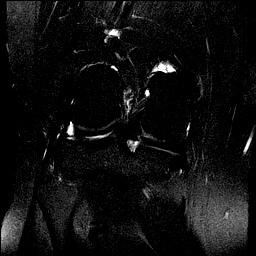
[im 17/28]
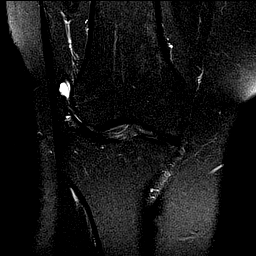
[im 22/28]
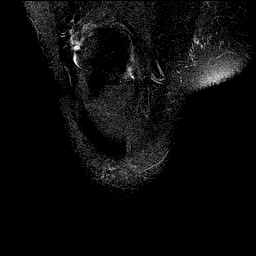
[im 28/28]
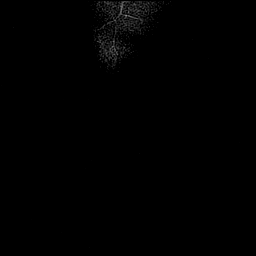

[Series 6: PD fat-sat · coronal · 4.0mm · 0.59mm/px · 6 of 28 slices shown (1 of 3)]
[im 1/28]
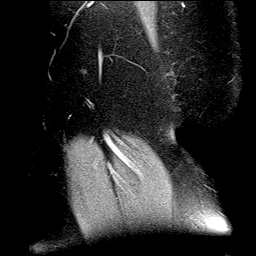
[im 6/28]
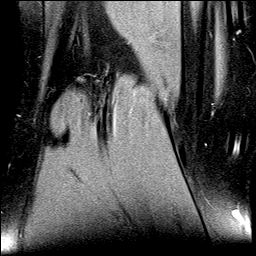
[im 11/28]
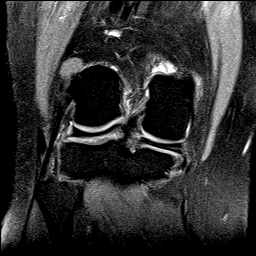
[im 17/28]
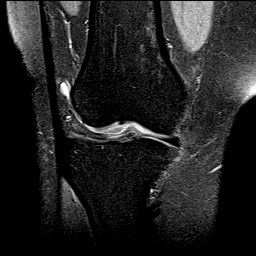
[im 22/28]
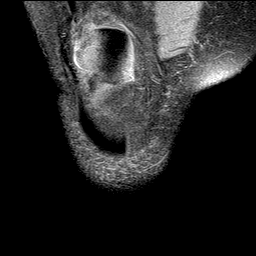
[im 28/28]
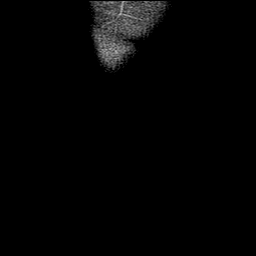

[Series 7: PD fat-sat · sagittal · 3.0mm · 0.59mm/px · 6 of 32 slices shown (2 of 3)]
[im 1/32]
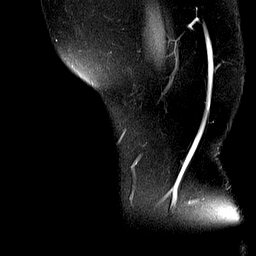
[im 7/32]
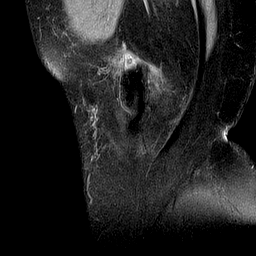
[im 13/32]
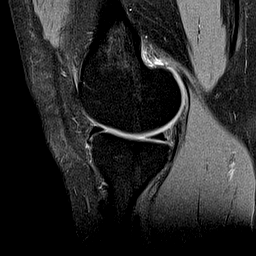
[im 19/32]
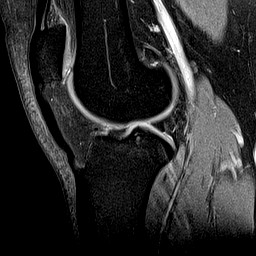
[im 25/32]
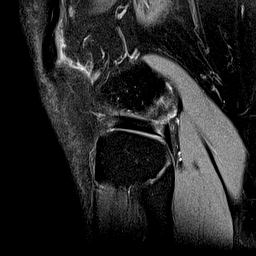
[im 32/32]
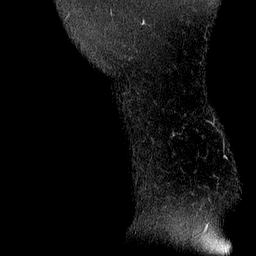

[Series 8: T2 fat-sat · sagittal · 3.0mm · 0.59mm/px · 6 of 32 slices shown (3 of 3)]
[im 1/32]
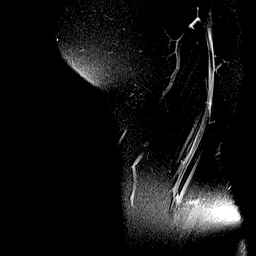
[im 7/32]
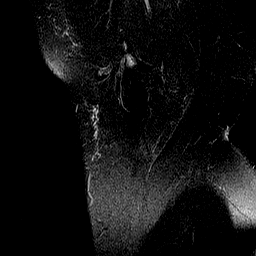
[im 13/32]
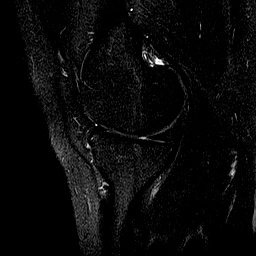
[im 19/32]
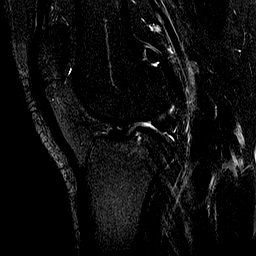
[im 25/32]
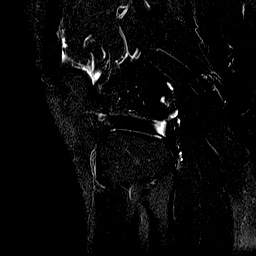
[im 32/32]
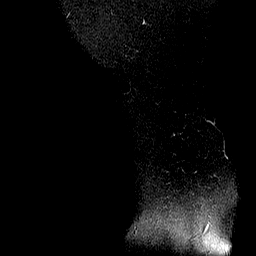

[Series 9: PD fat-sat · coronal · 2.0mm · 0.59mm/px · 4 of 21 slices shown (3 of 3)]
[im 1/21]
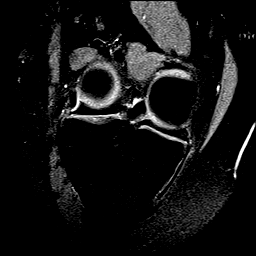
[im 7/21]
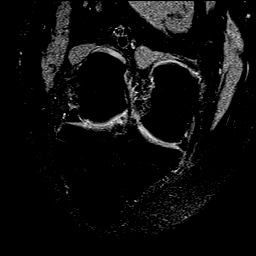
[im 14/21]
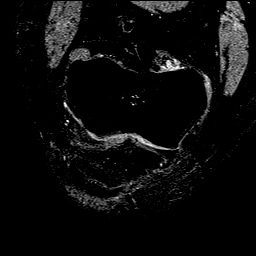
[im 21/21]
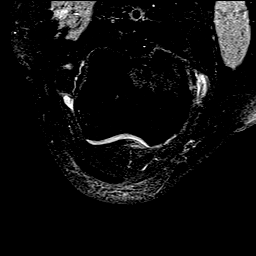

[40 of 40 positions shown; findings below may reference images not displayed]

FINDINGS: MENISCI

Medial: Intact.

Lateral: Intact.

LIGAMENTS

Cruciates: ACL and PCL are intact.

Collaterals: Medial collateral ligament is intact. Lateral
collateral ligament complex is intact.

CARTILAGE

Patellofemoral: There is a 6 mm wide immediate grade chondral defect
along the far superolateral aspect of the lateral patellar facet
(series 3, image 3).

Medial:  No chondral defect.

Lateral:  No chondral defect.

JOINT: No significant joint effusion. There is edema within the
superolateral aspect of Hoffa's fat pad (axial T2 image 10).

POPLITEAL FOSSA: No significant Baker's cyst.

EXTENSOR MECHANISM: Intact quadriceps tendon. Intact patellar
tendon. TT-TG measures 17 mm. Mild patella Alta.

BONES: No acute fracture or dislocation. No aggressive osseous
lesion. Intraosseous ganglion formation in the central tibial
spines.

Other: No fluid collection or hematoma. Muscles are normal.
IMPRESSION: Intermediate grade chondral defect measuring 6 mm wide along the far
superolateral aspect of the lateral patellar facet.

Mild edema within the superolateral aspect of Hoffa's fat pad, can
be seen in impingement related to patellar maltracking. TT-TG
distance measures 17 mm. Mild patella Alta

No evidence of meniscus tear or ligamentous injury.

## 2022-08-15 ENCOUNTER — Ambulatory Visit: Payer: 59 | Admitting: Family Medicine
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
## 2022-08-17 ENCOUNTER — Encounter: Payer: Self-pay | Admitting: Neurology

## 2022-08-17 ENCOUNTER — Ambulatory Visit: Payer: 59 | Admitting: Neurology

## 2022-08-17 VITALS — BP 123/76 | HR 92 | Ht 67.0 in | Wt 250.4 lb

## 2022-08-17 DIAGNOSIS — G932 Benign intracranial hypertension: Secondary | ICD-10-CM | POA: Diagnosis not present

## 2022-08-17 MED ORDER — ACETAZOLAMIDE ER 500 MG PO CP12
500.0000 mg | ORAL_CAPSULE | Freq: Two times a day (BID) | ORAL | 5 refills | Status: DC
Start: 2022-08-17 — End: 2023-02-15

## 2022-08-17 NOTE — Patient Instructions (Addendum)
Continue acetazolamide 563m twice daily Will check MRV of head Follow up 6 months.

## 2022-09-14 ENCOUNTER — Ambulatory Visit
Admission: RE | Admit: 2022-09-14 | Discharge: 2022-09-14 | Disposition: A | Discharge status: Home / Self Care | Payer: 59 | Source: Ambulatory Visit | Attending: Neurology | Admitting: Neurology

## 2022-09-14 DIAGNOSIS — G932 Benign intracranial hypertension: Secondary | ICD-10-CM

## 2022-10-17 ENCOUNTER — Encounter: Payer: Self-pay | Admitting: *Deleted

## 2022-11-23 ENCOUNTER — Ambulatory Visit (INDEPENDENT_AMBULATORY_CARE_PROVIDER_SITE_OTHER): Payer: 59 | Admitting: Family Medicine

## 2022-11-23 ENCOUNTER — Encounter: Payer: Self-pay | Admitting: Family Medicine

## 2022-11-23 VITALS — BP 118/72 | HR 90 | Temp 98.0°F | Ht 67.0 in | Wt 249.0 lb

## 2022-11-23 DIAGNOSIS — R0683 Snoring: Secondary | ICD-10-CM | POA: Diagnosis not present

## 2022-11-23 DIAGNOSIS — R5383 Other fatigue: Secondary | ICD-10-CM

## 2022-11-23 DIAGNOSIS — Z0001 Encounter for general adult medical examination with abnormal findings: Secondary | ICD-10-CM | POA: Diagnosis not present

## 2022-11-23 DIAGNOSIS — Z0000 Encounter for general adult medical examination without abnormal findings: Secondary | ICD-10-CM

## 2022-11-23 LAB — CBC
HCT: 41.5 % (ref 36.0–46.0)
Hemoglobin: 13.6 g/dL (ref 12.0–15.0)
MCHC: 32.8 g/dL (ref 30.0–36.0)
MCV: 90.5 fl (ref 78.0–100.0)
Platelets: 357 10*3/uL (ref 150.0–400.0)
RBC: 4.59 Mil/uL (ref 3.87–5.11)
RDW: 13.3 % (ref 11.5–15.5)
WBC: 4.7 10*3/uL (ref 4.0–10.5)

## 2022-11-23 LAB — COMPREHENSIVE METABOLIC PANEL
ALT: 17 U/L (ref 0–35)
AST: 19 U/L (ref 0–37)
Albumin: 4.3 g/dL (ref 3.5–5.2)
Alkaline Phosphatase: 59 U/L (ref 39–117)
BUN: 10 mg/dL (ref 6–23)
CO2: 28 mEq/L (ref 19–32)
Calcium: 9.9 mg/dL (ref 8.4–10.5)
Chloride: 104 mEq/L (ref 96–112)
Creatinine, Ser: 0.85 mg/dL (ref 0.40–1.20)
GFR: 96.36 mL/min (ref >60.00)
Glucose, Bld: 99 mg/dL (ref 70–99)
Potassium: 4.4 mEq/L (ref 3.5–5.1)
Sodium: 139 mEq/L (ref 135–145)
Total Bilirubin: 0.3 mg/dL (ref 0.2–1.2)
Total Protein: 7 g/dL (ref 6.0–8.3)

## 2022-11-23 LAB — TSH: TSH: 1.39 u[IU]/mL (ref 0.35–5.50)

## 2022-11-23 LAB — LIPID PANEL
Cholesterol: 180 mg/dL (ref 0–200)
HDL: 47.5 mg/dL (ref >39.00)
LDL Cholesterol: 113 mg/dL — ABNORMAL HIGH (ref 0–99)
NonHDL: 132.82
Total CHOL/HDL Ratio: 4
Triglycerides: 97 mg/dL (ref 0.0–149.0)
VLDL: 19.4 mg/dL (ref 0.0–40.0)

## 2022-11-23 LAB — VITAMIN D 25 HYDROXY (VIT D DEFICIENCY, FRACTURES): VITD: 14.3 ng/mL — ABNORMAL LOW (ref 30.00–100.00)

## 2022-11-23 LAB — VITAMIN B12: Vitamin B-12: 343 pg/mL (ref 211–911)

## 2022-11-23 NOTE — Progress Notes (Signed)
Chief Complaint  Patient presents with   Annual Exam    Fatigue and some concentration problems     Well Woman Michelle Hancock is here for a complete physical.   Her last physical was >1 year ago.  Current diet: in general, diet could be better. Current exercise: walking, elliptical, wt lifting. Contraception? Yes Fatigue out of ordinary? Yes Seatbelt? Yes Advanced directive? No  Health Maintenance Pap/HPV- Yes Tetanus- Yes HIV screening- Yes Hep C screening- Yes  Fatigue Going on for a year. By the evening, she will nod off for ~30 min. Feels very tired/low energy in addition to feeling sleepy. Does not feel well rested in the AM. Will fall asleep very easily. Gets around 6-7 hrs of sleep per night. Diet/exercise as above. Affecting concentration. Mood is stable.   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 60 capsule 5   Allergies Allergies  Allergen Reactions   Tacrolimus Hives    Review of Systems: Constitutional:  no unexpected weight changes Eye:  no recent significant change in vision Ear/Nose/Mouth/Throat:  Ears:  no tinnitus or vertigo and no recent change in hearing Nose/Mouth/Throat:  no complaints of nasal congestion, no sore throat Cardiovascular: no chest pain Respiratory:  no cough and no shortness of breath Gastrointestinal:  no abdominal pain, no change in bowel habits GU:  Female: negative for dysuria or pelvic  pain Musculoskeletal/Extremities:  no new pain of the joints Integumentary (Skin/Breast):  no abnormal skin lesions reported Neurologic:  no headaches Endocrine:  + fatigue Hematologic/Lymphatic:  No areas of easy bleeding  Exam BP 118/72 (BP Location: Left Arm, Patient Position: Sitting, Cuff Size: Large)   Pulse 90   Temp 98 F (36.7 C) (Oral)   Ht 5\' 7"  (1.702 m)   Wt 249 lb (112.9 kg)   SpO2 97%   BMI 39.00 kg/m  General:  well developed, well nourished, in no apparent distress Skin:  no significant moles, warts, or growths Head:  no masses, lesions, or tenderness Eyes:  pupils equal and round, sclera anicteric without injection Ears:  canals without lesions, TMs shiny without retraction, no obvious effusion, no erythema Nose:  nares patent, mucosa normal, and no drainage  Throat/Pharynx:  lips and gingiva without lesion; tongue and uvula midline; non-inflamed pharynx; no exudates or postnasal drainage Neck: neck supple without adenopathy, thyromegaly, or masses Lungs:  clear to auscultation, breath sounds equal bilaterally, no respiratory distress Cardio:  regular rate and rhythm, no bruits, no LE edema Abdomen:  abdomen soft, nontender; bowel sounds normal; no masses or organomegaly Genital: Defer to GYN Musculoskeletal:  symmetrical muscle groups noted without atrophy or deformity Extremities:  no clubbing, cyanosis, or edema, no deformities, no skin discoloration Neuro:  gait normal; deep tendon reflexes normal and symmetric Psych: well oriented with normal range of affect and appropriate judgment/insight  Assessment and Plan  Well adult exam - Plan:  CBC, Comprehensive metabolic panel, Lipid panel  Snoring  Fatigue, unspecified type - Plan: VITAMIN D 25 Hydroxy (Vit-D Deficiency, Fractures), TSH, B12   Well 22 y.o. female. Counseled on diet and exercise. Advanced directive form provided today.  Other orders as above. Snoring/fatigue: ck metabolic causes. Refer  sleep team.  Follow up in 1 yr. The patient voiced understanding and agreement to the plan.  [name] Maynard, DO 11/23/22 8:23 AM

## 2022-11-23 NOTE — Patient Instructions (Addendum)
Give us 2-3 business days to get the results of your labs back.  ? ?Keep the diet clean and stay active. ? ?Please get me a copy of your advanced directive form at your convenience.  ? ?If you do not hear anything about your referral in the next 1-2 weeks, call our office and ask for an update. ? ?Let us know if you need anything. ?

## 2022-11-24 ENCOUNTER — Other Ambulatory Visit: Payer: Self-pay | Admitting: Family Medicine

## 2022-11-24 ENCOUNTER — Encounter: Payer: Self-pay | Admitting: Family Medicine

## 2022-11-24 DIAGNOSIS — E559 Vitamin D deficiency, unspecified: Secondary | ICD-10-CM

## 2023-01-09 ENCOUNTER — Institutional Professional Consult (permissible substitution): Payer: 59 | Admitting: Nurse Practitioner

## 2023-01-23 ENCOUNTER — Telehealth: Payer: Self-pay | Admitting: Neurology

## 2023-01-23 MED ORDER — MECLIZINE HCL 25 MG PO TABS: 25.0000 mg | ORAL_TABLET | Freq: Four times a day (QID) | ORAL | 0 refills | Status: DC | PRN

## 2023-01-23 NOTE — Telephone Encounter (Signed)
Per patient all this started at 2 pm, Just sitting at her desk. The dizziness is was worse then before.    Per patient she is still at work. She hasn't left to see anyone because she feels like no one will know what to do or not do anything at all.

## 2023-01-23 NOTE — Telephone Encounter (Signed)
Per Dr.Jaffe, We can prescribe her meclizine 25mg  every 6 hours as needed to treat the dizziness.  If she feels she cannot function with current symptoms, she should go to Urgent Care for evaluation.

## 2023-01-23 NOTE — Telephone Encounter (Signed)
Patient is calling experiencing extreme dizziness, light headedness, nausea and balance being off. First time in year. Patient is at work  and can't function Michelle Hancock

## 2023-01-27 ENCOUNTER — Encounter: Payer: Self-pay | Admitting: Family Medicine

## 2023-02-02 ENCOUNTER — Encounter: Payer: Self-pay | Admitting: Nurse Practitioner

## 2023-02-02 ENCOUNTER — Ambulatory Visit (INDEPENDENT_AMBULATORY_CARE_PROVIDER_SITE_OTHER): Payer: 59 | Admitting: Nurse Practitioner

## 2023-02-02 VITALS — BP 120/72 | HR 80 | Ht 67.0 in | Wt 246.2 lb

## 2023-02-02 DIAGNOSIS — E669 Obesity, unspecified: Secondary | ICD-10-CM | POA: Insufficient documentation

## 2023-02-02 DIAGNOSIS — G4719 Other hypersomnia: Secondary | ICD-10-CM | POA: Diagnosis not present

## 2023-02-02 DIAGNOSIS — R0683 Snoring: Secondary | ICD-10-CM | POA: Diagnosis not present

## 2023-02-02 NOTE — Progress Notes (Signed)
@Patient  ID: Michelle Hancock, female    DOB: 2000-08-22, 22 y.o.   MRN: [id number]  Chief Complaint  Patient presents with   Follow-up    Daytime sleepiness, snores, never had sleep study before.     Referring provider: Sharlene Dory*  HPI: 22 year old female, never smoker referred for sleep consult.  Past medical history significant for allergies, obesity.  TEST/EVENTS:   02/02/2023: Today-sleep consult Patient presents today for sleep consult, referred by Dr. Carmelia Roller. She has issues with daytime sleepiness. Feels tired most days. Most of the time she wakes up feeling like she didn't sleep well even though she feels like she sleeps heavily. Has had some issues with drowsy driving in the past but never fallen asleep while driving. She does have struggle with episodes where she will doze off during the day without trying. This is usually when she is sitting in the bed, working on her computer. She has had episodes of sleep paralysis; hasn't happened recently. Has been told she snores loudly. Symptoms have been ongoing for a few years. No morning headaches or sleep parasomnias. Goes to bed between 10 PM and midnight.  Falls asleep very quickly.  Does not usually wake up with sometimes she will get up once to use the restroom.  Gets up around 6:30 AM.  Does not operate any heavy machinery in her job Animal nutritionist.  Weight has fluctuated over the last 2 years.  Never had a previous sleep study.  No supplemental oxygen. No history of cardiac disease, stroke or diabetes.  She is a never smoker.  Does not drink any alcohol.  No excessive caffeine intake.  Lives with her immediate family.  Works in HR.  Family history of breast cancer and skin cancer.  Epworth 13   Allergies  Allergen Reactions   Tacrolimus Hives    Immunization History  Administered Date(s) Administered   HPV 9-valent 07/11/2014, 04/11/2016   HPV Quadrivalent 04/26/2013   Hepatitis A 02/23/2010   Hepatitis A, Ped/Adol-2  Dose 04/11/2016   Influenza Whole 05/22/2009   Influenza,inj,Quad PF,6+ Mos 04/26/2013, 06/25/2014, 04/11/2016   Meningococcal Conjugate 04/26/2013, 04/11/2016   PFIZER Comirnaty(Gray Top)Covid-19 Tri-Sucrose Vaccine 06/15/2020, 07/06/2020   Td 02/23/2010   Tdap 12/22/2020   Varicella 02/23/2010    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     TRI-LO-MARZIA 0.18/0.215/0.25 MG-25 MCG tab Take 1 tablet by mouth daily.     acetaZOLAMIDE ER (DIAMOX) 500 MG capsule Take 1 capsule (500 mg total) by mouth 2 (two) times daily. (Patient not taking: Reported on 02/02/2023) 60 capsule 5   meclizine (MEDI-MECLIZINE) 25 MG tablet Take 1 tablet (25 mg total) by mouth every 6 (six) hours as needed for dizziness. 30 tablet 0   No facility-administered medications prior to visit.     Review of Systems:   Constitutional: No weight loss or gain, night sweats, fevers, chills, or lassitude. +excessive daytime fatigue  HEENT: No headaches, difficulty swallowing, tooth/dental  problems, or sore throat. +allergy symptoms CV:  No chest pain, orthopnea, PND, swelling in lower extremities, anasarca, dizziness, palpitations, syncope Resp: +snoring. No shortness of breath with exertion or at rest. No excess mucus or change in color of mucus. No productive or non-productive. No hemoptysis. No wheezing.  No chest wall deformity GI:  No heartburn, indigestion GU: No nocturia Skin: No rash, lesions, ulcerations MSK:   No joint pain or swelling.   Neuro: No dizziness or lightheadedness.  Psych: No depression or anxiety. Mood stable.     Physical Exam:  BP 120/72   Pulse 80   Ht 5\' 7"  (1.702 m)   Wt 246 lb 3.2 oz (111.7 kg)   SpO2 99%   BMI 38.56 kg/m   GEN: Pleasant, interactive, well-appearing; obese; in no acute distress. HEENT:  Normocephalic and atraumatic. PERRLA. Sclera white. Nasal turbinates pink, moist and patent bilaterally. No rhinorrhea present. Oropharynx pink and moist, without exudate or edema. No lesions, ulcerations, or postnasal drip. Mallampati III NECK:  Supple w/ fair ROM. No JVD present. Normal carotid impulses w/o bruits. Thyroid symmetrical with no goiter or nodules palpated. No lymphadenopathy.   CV: RRR, no m/r/g, no peripheral edema. Pulses intact, +2 bilaterally. No cyanosis, pallor or clubbing. PULMONARY:  Unlabored, regular breathing. Clear bilaterally A&P w/o wheezes/rales/rhonchi. No accessory muscle use.  GI: BS present and normoactive. Soft, non-tender to palpation. No organomegaly or masses detected.  MSK: No erythema, warmth or tenderness. Cap refil <2 sec all extrem. No deformities or joint swelling noted.  Neuro: A/Ox3. No focal deficits noted.   Skin: Warm, no lesions or rashe Psych: Normal affect and behavior. Judgement and thought content appropriate.     Lab Results:  CBC    Component Value Date/Time   WBC 4.7 11/23/2022 0831   RBC 4.59 11/23/2022 0831   HGB 13.6 11/23/2022 0831   HGB 13.9 11/02/2020 0935   HCT 41.5 11/23/2022 0831   HCT 41.6 11/02/2020 0935   PLT 357.0 11/23/2022 0831   PLT 290 11/02/2020 0935   MCV 90.5 11/23/2022 0831   MCV 90 11/02/2020 0935   MCH 30.2 11/02/2020 0935   MCH 30.1 03/11/2020 1628   MCHC 32.8 11/23/2022 0831   RDW 13.3 11/23/2022 0831   RDW 12.9 11/02/2020 0935   LYMPHSABS 1.2 11/02/2020 0935   MONOABS 0.5 03/11/2020 1628   EOSABS 0.1 11/02/2020 0935   BASOSABS 0.0 11/02/2020 0935    BMET     Component Value Date/Time   NA 139 11/23/2022 0831   NA 140 11/02/2020 0935   K 4.4 11/23/2022 0831   CL 104 11/23/2022 0831   CO2 28 11/23/2022 0831   GLUCOSE 99 11/23/2022 0831   BUN 10 11/23/2022 0831   BUN 8 11/02/2020 0935   CREATININE 0.85 11/23/2022 0831   CALCIUM 9.9 11/23/2022 0831   GFRNONAA >60 03/11/2020 1628   GFRAA >60 03/11/2020 1628    BNP No results found for: "BNP"   Imaging:  No results found.  Administration History     None           No data to display          No results found for: "NITRICOXIDE"      Assessment & Plan:   Daytime hypersomnolence She has snoring, excessive daytime sleepiness, frequent dozing during the day, poor sleep quality. BMI 38. Epworth 13. Given this,  I am concerned she could have sleep disordered breathing with obstructive sleep apnea. She will need  sleep study for further evaluation. If no evidence of significant sleep disordered breathing, would recommend MSLT to assess for narcolepsy.    - discussed how weight can impact sleep and risk for sleep disordered breathing - discussed options to assist with weight loss: combination of diet modification, cardiovascular and strength training exercises   - had an extensive discussion regarding the adverse health consequences related to untreated sleep disordered breathing - specifically discussed the risks for hypertension, coronary artery disease, cardiac dysrhythmias, cerebrovascular disease, and diabetes - lifestyle modification discussed   - discussed how sleep disruption can increase risk of accidents, particularly when driving - safe driving practices were discussed   Loud snoring See above  Obesity (BMI 30-39.9) BMI 38. Healthy weight loss encouraged   I spent 35 minutes of dedicated to the care of this patient on the date of this encounter to include pre-visit review of records, face-to-face time with the patient discussing conditions above, post visit  ordering of testing, clinical documentation with the electronic health record, making appropriate referrals as documented, and communicating necessary findings to members of the patients care team.  Noemi Chapel, NP 02/02/2023  Pt aware and understands NP's role.

## 2023-02-02 NOTE — Assessment & Plan Note (Signed)
She has snoring, excessive daytime sleepiness, frequent dozing during the day, poor sleep quality. BMI 38. Epworth 13. Given this,  I am concerned she could have sleep disordered breathing with obstructive sleep apnea. She will need sleep study for further evaluation. If no evidence of significant sleep disordered breathing, would recommend MSLT to assess for narcolepsy.    - discussed how weight can impact sleep and risk for sleep disordered breathing - discussed options to assist with weight loss: combination of diet modification, cardiovascular and strength training exercises   - had an extensive discussion regarding the adverse health consequences related to untreated sleep disordered breathing - specifically discussed the risks for hypertension, coronary artery disease, cardiac dysrhythmias, cerebrovascular disease, and diabetes - lifestyle modification discussed   - discussed how sleep disruption can increase risk of accidents, particularly when driving - safe driving practices were discussed

## 2023-02-02 NOTE — Assessment & Plan Note (Signed)
See above

## 2023-02-02 NOTE — Patient Instructions (Signed)
Given your symptoms, I am concerned that you may have sleep disordered breathing with sleep apnea. You will need a sleep study for further evaluation. Someone will contact you to schedule this.   We discussed how untreated sleep apnea puts an individual at risk for cardiac arrhthymias, pulm HTN, DM, stroke and increases their risk for daytime accidents. We also briefly reviewed treatment options including weight loss, side sleeping position, oral appliance, CPAP therapy or referral to ENT for possible surgical options  Use caution when driving and pull over if you become sleepy.  Follow up in 6 weeks with Michelle Cobb,NP to go over sleep study results, or sooner, if needed   

## 2023-02-02 NOTE — Assessment & Plan Note (Signed)
BMI 38. Healthy weight loss encouraged.  

## 2023-02-14 NOTE — Progress Notes (Unsigned)
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 Vertigo/headache - unclear if symptoms of IIH, vestibular migraine or BPPV Cavernous angioma, stable    Restart acetazolamide 500mg  twice daily  Meclizine 25mg  as needed for vertigo attacks Follow up with ophthalmology as scheduled Follow up 6 months.     Subjective:  Michelle Hancock is a 22 year old female with PCOS who follows up for lightheadedness.  MRI brain personally reviewed.  UPDATE: Current medication:  none  MRV of head on 09/14/2022 personally reviewed was normal.  She hasn't been on acetazolamide for several months.  No repeat eye exam.  She had been doing well until last month when she had a recurrence of dizziness causing difficulty functioning, the first time in a year.  He looked up at the desk and developed spinning sensation.  She stood up and felt off-balance.  She walked into a room to rest and fell to the floor.  She got up and rested for a while.  Went home and went to sleep for several hours.  Woke up and felt groggy but dizziness resolved.  Over the past few days, she has been having headaches.  Notices it when she bends over and sits up or stands up from a chair.  A pressure across the forehead.  Lasts 30 seconds.  If she is bent over and stands back up, she may briefly have dimming of vision.  No pulsatile tinnitus.     HISTORY: She reports episodes of dizziness for over a year.  Occurs spontaneously, not just positional.  Sudden lightheadedness with blurred vision and nausea.  Sometimes may note palpitations.  Initially lasts 2 minutes but more recent episode lasted 15 minutes.  Felt fatigued afterwards.  No associated headache, speech disturbance, photophobia, phonophobia, loss of consciousness, numbness or weakness.  They are not frequent, maybe 8 to 10 episodes over past year, last one was a month ago.  Since a few  weeks ago, she also notes that she is more forgetful.  If she is talking, her mind "goes blank" - some slurring of words and putting sentences together.  For example, she started having difficulty giving the same speech at work that she has given for years.  One time when she left the house, she noticed a car parked at the end of her driveway.  When she got in her car, she forgot the car was there and she backed up into the car.  MRI of brain on 11/30/2021 showed constellation of findings that may be seen in idiopathic intracranial hypertension such as partially empty sella and mild symmetric CSF prominence within opti nerve sheaths.  She was seen by ophthalmologist Dr. 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  Despite normal eye exam, she underwent lumbar puncture on 04/22/2022, which revealed an opening pressure of 33 cm water.  Started on acetazolamide.   She previously was seen by neurology in 2020 when she was experiencing recurrent episodes of left sided face, arm and leg numbness and pain with left arm and leg weakness lasting several minutes.  She was seen in the ED on several occasions for these spells.  MRI of the brain on 11/11/2018 showed probable small cavernous angioma in the right frontal lobe but otherwise unremarkable.  She subsequently had  an EEG which was normal.  NCV-EMG revealed moderate left and mld right carpal tunnel syndrome.  She subsequently underwent left carpal tunnel release in December 2020.  These spells subsequently resolved.  Later that year, she started experiencing headaches, right sided pressure-like and throbbing headache lasting a few minutes without nausea, vomiting, photophobia, phonophobia, visual disturbance, numbness, weakness or autonomic symptoms.  CT head on 09/06/2019 was normal.  Headaches subsequently resolved.    Underwent repeat testing  with me: 11/04/2021 LABS:  B12 504, TSH 1.59 11/24/2021 EEG:  Normal awake and asleep EEG.  Patient exhibited one of her habitual spells that did not reveal an electrographic correlate.   11/30/2021 MRI BRAIN W WO:  Partially empty sella turcica, new from the prior MRI of 11/11/2018.  Mild symmetric CSF prominence within the optic nerve sheaths. This constellation of findings can be seen in the setting of idiopathic intracranial hypertension (pseudotumor cerebri), and this diagnosis should be considered given the provided history of blurred vision. Consider correlation with CSF opening pressure. Unchanged subcentimeter focus of susceptibility-weighted signal loss within the mid-to-posterior right frontal lobe (within an adjacent small developmental venous anomaly), likely reflecting a cavernoma.    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 (Patient not taking: Reported on 02/02/2023) 60 capsule 5   doxycycline (ADOXA) 100 MG tablet Take 100 mg by mouth daily.     meclizine (MEDI-MECLIZINE) 25 MG tablet Take 1 tablet (25 mg total) by mouth every 6 (six) hours as needed for dizziness. 30 tablet 0   TRI-LO-MARZIA 0.18/0.215/0.25 MG-25 MCG tab Take 1 tablet by mouth daily.     No current facility-administered medications on file prior to visit.    ALLERGIES: Allergies  Allergen Reactions   Tacrolimus Hives    FAMILY HISTORY: Family History  Problem Relation Age of Onset   Allergies Father    Migraines Father     Allergic rhinitis Father    Gestational diabetes Mother    Hypertension Mother    Hyperlipidemia Mother    Breast cancer Maternal Aunt    Heart attack Maternal Grandmother       Objective:  Blood pressure 109/75, pulse 100, height 5\' 7"  (1.702 m), weight 249 lb 6.4 oz (113.1 kg), SpO2 99%. General: No acute distress.  Patient appears well-groomed.   Head:  Normocephalic/atraumatic Neck:  Supple.  No paraspinal tenderness.  Full range of motion. Heart:  Regular rate and rhythm. Neuro:  Alert and oriented.  Speech fluent and not dysarthric.  Language intact.  CN II-XII intact.  Bulk and tone normal.  Muscle strength 5/5 throughout.  Deep tendon reflexes 2+ throughout.  Gait normal.  Romberg negative.    Shon Millet, DO  CC: Arva Chafe, DO

## 2023-02-15 ENCOUNTER — Ambulatory Visit (INDEPENDENT_AMBULATORY_CARE_PROVIDER_SITE_OTHER): Payer: 59 | Admitting: Neurology

## 2023-02-15 ENCOUNTER — Encounter: Payer: Self-pay | Admitting: Neurology

## 2023-02-15 VITALS — BP 109/75 | HR 100 | Ht 67.0 in | Wt 249.4 lb

## 2023-02-15 DIAGNOSIS — G932 Benign intracranial hypertension: Secondary | ICD-10-CM

## 2023-02-15 DIAGNOSIS — R42 Dizziness and giddiness: Secondary | ICD-10-CM | POA: Diagnosis not present

## 2023-02-15 MED ORDER — ACETAZOLAMIDE ER 500 MG PO CP12: 500.0000 mg | ORAL_CAPSULE | Freq: Two times a day (BID) | ORAL | 5 refills | Status: DC

## 2023-02-15 NOTE — Patient Instructions (Addendum)
Restart acetazolamide 500mg  twice daily.  If no improvement in 6 weeks, contact me Take meclizine sparingly if you get a vertigo attack Follow up with Dr. Randon Goldsmith office Follow up with me in 6 months.

## 2023-02-21 ENCOUNTER — Other Ambulatory Visit (INDEPENDENT_AMBULATORY_CARE_PROVIDER_SITE_OTHER): Payer: 59

## 2023-02-21 DIAGNOSIS — E559 Vitamin D deficiency, unspecified: Secondary | ICD-10-CM | POA: Diagnosis not present

## 2023-02-21 LAB — VITAMIN D 25 HYDROXY (VIT D DEFICIENCY, FRACTURES): VITD: 33.1 ng/mL (ref 30.00–100.00)

## 2023-02-27 ENCOUNTER — Encounter: Payer: Self-pay | Admitting: Neurology

## 2023-03-02 ENCOUNTER — Telehealth: Payer: Self-pay | Admitting: Pulmonary Disease

## 2023-03-02 ENCOUNTER — Encounter (INDEPENDENT_AMBULATORY_CARE_PROVIDER_SITE_OTHER): Payer: 59

## 2023-03-02 DIAGNOSIS — G4733 Obstructive sleep apnea (adult) (pediatric): Secondary | ICD-10-CM | POA: Diagnosis not present

## 2023-03-02 DIAGNOSIS — R0683 Snoring: Secondary | ICD-10-CM

## 2023-03-02 DIAGNOSIS — G4719 Other hypersomnia: Secondary | ICD-10-CM

## 2023-03-02 NOTE — Progress Notes (Signed)
NEUROLOGY FOLLOW UP OFFICE NOTE  AVIYANNA Hancock [id number]  Assessment/Plan:   Left sided trigeminal neuralgia, unclear if secondary to ear infection. 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 Cavernous angioma, stable    Start carbamazepine 100mg  twice daily.  Check CBC and CMP. Check MRI of left trigeminal nerve with and without contrast Hold off on starting acetazolamide for now.       Subjective:  Michelle Hancock is a 22 year old female with PCOS who follows up for lightheadedness.    UPDATE:  Starting earlier this month, she has been having headaches.  Notices it when she bends over and sits up or stands up from a chair.  A pressure across the forehead.  Lasts 30 seconds.  If she is bent over and stands back up, she may briefly have dimming of vision.  No pulsatile tinnitus.  We planned to restart acetazolamide 500mg  twice daily on 8/14.     On 8/16 she developed "Swimmer's Ear" after getting water in her ear at the beach.  She has had history of recurrent ear infections and felt her left ear was stuffed up.  She saw her ENT on 8/21 and strated on amoxicillin and oflaxcin drops.  The fullness in her left ear has not dissipated.  However, she started experiencing numbness and pins and needles sensation along the left jaw line and left perioral region which gradually spread to involve the entire left V2-V3 distribution.  She also developed sharp aching pain in the left side of her lower face and in her throat when she swallows.  Pain is also aggravated by yawning and chewing.  No ear pain or discharge.  Hearing is muffled in the left ear.  No facial droop or involvement of extremities.    Due to starting antibiotics, she held off on starting acetazolamide, but headaches have improved.    HISTORY: She reports episodes of dizziness for over a year.  Occurs spontaneously, not just positional.  Sudden lightheadedness with blurred vision and nausea.  Sometimes may  note palpitations.  Initially lasts 2 minutes but more recent episode lasted 15 minutes.  Felt fatigued afterwards.  No associated headache, speech disturbance, photophobia, phonophobia, loss of consciousness, numbness or weakness.  They are not frequent, maybe 8 to 10 episodes over past year, last one was a month ago.  Since a few weeks ago, she also notes that she is more forgetful.  If she is talking, her mind "goes blank" - some slurring of words and putting sentences together.  For example, she started having difficulty giving the same speech at work that she has given for years.  One time when she left the house, she noticed a car parked at the end of her driveway.  When she got in her car, she forgot the car was there and she backed up into the car.  MRI of brain on 11/30/2021 showed constellation of findings that may be seen in idiopathic intracranial hypertension such as partially empty sella and mild symmetric CSF prominence within opti nerve sheaths.  She was seen by ophthalmologist Dr. 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  Despite normal eye exam, she underwent lumbar puncture on 04/22/2022, which revealed an opening pressure of 33 cm water.  MRV of head on 09/14/2022 personally reviewed was normal.   She previously was seen by  neurology in 2020 when she was experiencing recurrent episodes of left sided face, arm and leg numbness and pain with left arm and leg weakness lasting several minutes.  She was seen in the ED on several occasions for these spells.  MRI of the brain on 11/11/2018 showed probable small cavernous angioma in the right frontal lobe but otherwise unremarkable.  She subsequently had an EEG which was normal.  NCV-EMG revealed moderate left and mld right carpal tunnel syndrome.  She subsequently underwent left carpal tunnel release in December 2020.  These  spells subsequently resolved.  In 2023, she started experiencing headaches, right sided pressure-like and throbbing headache lasting a few minutes without nausea, vomiting, photophobia, phonophobia, visual disturbance, numbness, weakness or autonomic symptoms.  CT head on 09/06/2019 was normal.  Headaches subsequently resolved.    Underwent repeat testing with me: 11/04/2021 LABS:  B12 504, TSH 1.59 11/24/2021 EEG:  Normal awake and asleep EEG.  Patient exhibited one of her habitual spells that did not reveal an electrographic correlate.   11/30/2021 MRI BRAIN W WO:  Partially empty sella turcica, new from the prior MRI of 11/11/2018.  Mild symmetric CSF prominence within the optic nerve sheaths. This constellation of findings can be seen in the setting of idiopathic intracranial hypertension (pseudotumor cerebri), and this diagnosis should be considered given the provided history of blurred vision. Consider correlation with CSF opening pressure. Unchanged subcentimeter focus of susceptibility-weighted signal loss within the mid-to-posterior right frontal lobe (within an adjacent small developmental venous anomaly), likely reflecting a cavernoma. 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 60 capsule 5   doxycycline (ADOXA) 100 MG tablet Take 100 mg by mouth daily.     meclizine (MEDI-MECLIZINE) 25 MG tablet Take 1 tablet (25 mg total) by  mouth every 6 (six) hours as needed for dizziness. 30 tablet 0   TRI-LO-MARZIA 0.18/0.215/0.25 MG-25 MCG tab Take 1 tablet by mouth daily.     No current facility-administered medications on file prior to visit.    ALLERGIES: Allergies  Allergen Reactions   Tacrolimus Hives    FAMILY HISTORY: Family History  Problem Relation Age of Onset   Allergies Father    Migraines Father    Allergic rhinitis Father    Gestational diabetes Mother    Hypertension Mother    Hyperlipidemia Mother    Breast cancer Maternal Aunt    Heart attack Maternal Grandmother       Objective:  Blood pressure 122/84, pulse 92, height 5\' 7"  (1.702 m), weight 249 lb (112.9 kg), SpO2 100%.. General: No acute distress.  Patient appears well-groomed.   Head:  Normocephalic/atraumatic Neck:  Supple.  No paraspinal tenderness.  Full range of motion. Heart:  Regular rate and rhythm. Neuro:  alert and oriented.  Speech fluent and not dysarthric, language intact.  Decreased hearing in left ear, otherwise CN II-XII intact. Bulk and tone normal, muscle strength 5/5 throughout.  Sensation to light touch intact.  Deep tendon reflexes 2+ throughout.  Finger to nose testing intact.  Gait normal, Romberg negative.    Shon Millet, DO  CC: Arva Chafe, DO

## 2023-03-02 NOTE — Telephone Encounter (Signed)
Ret Bonnie[facility] call. I read results to her but she still has questions. Please try again, Thanks.

## 2023-03-02 NOTE — Telephone Encounter (Signed)
ATC x1 LVM for patient to call  our office back regarding sleep study result's.  

## 2023-03-02 NOTE — Telephone Encounter (Signed)
Call patient  Sleep study result  Date of study: 02/11/2023  Impression: Very minimal sleep disordered breathing that does not meet treatment threshold with an AHI of 3.2.   Recommendation:  An in lab study may be considered if there remains significant concern for sleep disordered breathing to optimally ascertain absence of significant sleep disordered breathing  Encourage aggressive weight loss measures  Close clinical follow-up optimization of treatment

## 2023-03-03 ENCOUNTER — Ambulatory Visit: Payer: 59 | Admitting: Neurology

## 2023-03-03 ENCOUNTER — Encounter: Payer: Self-pay | Admitting: Neurology

## 2023-03-03 VITALS — BP 122/84 | HR 92 | Ht 67.0 in | Wt 249.0 lb

## 2023-03-03 DIAGNOSIS — Z79899 Other long term (current) drug therapy: Secondary | ICD-10-CM

## 2023-03-03 DIAGNOSIS — G500 Trigeminal neuralgia: Secondary | ICD-10-CM

## 2023-03-03 MED ORDER — CARBAMAZEPINE ER 100 MG PO TB12: 100.0000 mg | ORAL_TABLET | Freq: Two times a day (BID) | ORAL | 5 refills | Status: DC

## 2023-03-03 NOTE — Patient Instructions (Signed)
Check MRI right trigeminal nerve with and without contrast Start carbamazepine 100mg  twice daily - we can increase dose if needed. Check CBC and CMP

## 2023-03-03 NOTE — Telephone Encounter (Signed)
ATC x2 LVM for patient to call our office back

## 2023-03-07 NOTE — Telephone Encounter (Signed)
Left message for patient to return our call.

## 2023-03-07 NOTE — Telephone Encounter (Signed)
Patient is calling back. She can be reached at [phone].

## 2023-03-07 NOTE — Telephone Encounter (Signed)
Patient didn't really have any questions she states that she was told to call back in message. She asked I forward results to Dr Carmelia Roller and I did.

## 2023-03-16 ENCOUNTER — Telehealth (INDEPENDENT_AMBULATORY_CARE_PROVIDER_SITE_OTHER): Payer: 59 | Admitting: Nurse Practitioner

## 2023-03-16 ENCOUNTER — Encounter: Payer: Self-pay | Admitting: Nurse Practitioner

## 2023-03-16 DIAGNOSIS — G4710 Hypersomnia, unspecified: Secondary | ICD-10-CM | POA: Diagnosis not present

## 2023-03-16 DIAGNOSIS — G4719 Other hypersomnia: Secondary | ICD-10-CM

## 2023-03-16 NOTE — Assessment & Plan Note (Signed)
Home sleep study without significant sleep disordered breathing. Struggles with daytime sleepiness daily. No sedating medications. Concern for possible narcolepsy. Will order MSLT for further evaluation. She will need PSG beforehand. Educated on upcoming studies. Advised on safe driving practices. Encouraged scheduling naps as able.   Patient Instructions  Your sleep study did not reveal any significant sleep apnea. Given your daytime symptoms, we will have you complete an overnight lab study follow by multi sleep latency study, which is a nap test. Someone will contact you to schedule this.  Use caution when driving and pull over if you become sleepy  Schedule a nap in the afternoon, if able.  Follow up in 8-10 weeks to review test results with Dr. Wynona Neat. If symptoms worsen, please contact office for sooner follow up

## 2023-03-16 NOTE — Progress Notes (Signed)
Patient ID: Michelle Hancock, female     DOB: Mar 05, 2001, 22 y.o.      MRN: [id number]  Chief Complaint  Patient presents with   Follow-up    F/u hst     Virtual Visit via Video Note  I connected with Michelle Hancock on 03/16/23 at  9:00 AM EDT by a video enabled telemedicine application and verified that I am speaking with the correct person using two identifiers.  Location: Patient: Home Provider: Office   I discussed the limitations of evaluation and management by telemedicine and the availability of in person appointments. The patient expressed understanding and agreed to proceed.  History of Present Illness: 22 year old female, never smoker follow for hypersomnia.  Past medical history significant for allergies, obesity.   TEST/EVENTS:  02/11/2023 HST: 3.2/h  02/02/2023: Sudie Grumbling with Hebert Dooling NP for sleep consult, referred by Dr. Carmelia Roller. She has issues with daytime sleepiness. Feels tired most days. Most of the time she wakes up feeling like she didn't sleep well even though she feels like she sleeps heavily. Has had some issues with drowsy driving in the past but never fallen asleep while driving. She does have struggle with episodes where she will doze off during the day without trying. This is usually when she is sitting in the bed, working on her computer. She has had episodes of sleep paralysis; hasn't happened recently. Has been told she snores loudly. Symptoms have been ongoing for a few years. No morning headaches or sleep parasomnias. Goes to bed between 10 PM and midnight.  Falls asleep very quickly.  Does not usually wake up with sometimes she will get up once to use the restroom.  Gets up around 6:30 AM.  Does not operate any heavy machinery in her job Animal nutritionist.  Weight has fluctuated over the last 2 years.  Never had a previous sleep study.  No supplemental oxygen. No history of cardiac disease, stroke or diabetes.  She is a never smoker.  Does not drink any alcohol.  No excessive caffeine  intake.  Lives with her immediate family.  Works in HR.  Family history of breast cancer and skin cancer. [place] 11  03/16/2023: Today - follow up Patient presents today for follow up after home sleep study, which did not reveal any significant sleep disordered breathing. She has significant trouble with daytime sleepiness. She dozes off without trying during the day. She feels the same as our last visit. Does have some drowsy driving but never fallen asleep while driving. No morning headaches or sleep parasomnias.   Allergies  Allergen Reactions   Tacrolimus Hives   Immunization History  Administered Date(s) Administered   HPV 9-valent 07/11/2014, 04/11/2016   HPV Quadrivalent 04/26/2013   Hepatitis A 02/23/2010   Hepatitis A, Ped/Adol-2 Dose 04/11/2016   Influenza Whole 05/22/2009   Influenza,inj,Quad PF,6+ Mos 04/26/2013, 06/25/2014, 04/11/2016   Meningococcal Conjugate 04/26/2013, 04/11/2016   PFIZER Comirnaty(Gray Top)Covid-19 Tri-Sucrose Vaccine 06/15/2020, 07/06/2020   Td 02/23/2010   Tdap 12/22/2020   Varicella 02/23/2010   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 60 capsule 5   carbamazepine (TEGRETOL XR) 100 MG 12 hr tablet Take 1 tablet (100 mg total) by mouth 2 (two) times daily. 60 tablet 5   meclizine (MEDI-MECLIZINE) 25 MG tablet Take 1 tablet (25 mg total) by mouth every 6 (six) hours as needed for dizziness. 30 tablet 0    TRI-LO-MARZIA 0.18/0.215/0.25 MG-25 MCG tab Take 1 tablet by mouth daily.     doxycycline (ADOXA) 100 MG tablet Take 100 mg by mouth daily. (Patient not taking: Reported on 03/16/2023)     No facility-administered medications prior to visit.     Review of Systems:   Constitutional: No weight loss or gain, night sweats, fevers, chills, or lassitude. +excessive daytime fatigue  HEENT: No headaches, difficulty swallowing, tooth/dental problems, or sore throat. +allergy symptoms CV:  No chest pain, orthopnea, PND, swelling in lower extremities, anasarca, dizziness, palpitations, syncope Resp: +snoring. No shortness of breath with exertion or at rest. No excess mucus or change in color of mucus. No productive or non-productive. No hemoptysis. No wheezing.  No chest wall deformity GI:  No heartburn, indigestion GU: No nocturia Skin: No rash, lesions, ulcerations MSK:  No joint pain or swelling.   Neuro: No dizziness or lightheadedness.  Psych: No depression or anxiety. Mood stable.  Observations/Objective: Patient is well-developed, well-nourished in no acute distress. A&Ox3. Resting comfortably at home. Unlabored breathing. Speech is clear and coherent with logical content.    Assessment and Plan: Daytime hypersomnolence Home sleep study without significant sleep disordered breathing. Struggles with daytime sleepiness daily. No sedating medications. Concern for possible narcolepsy. Will order MSLT for further evaluation. She will need PSG beforehand. Educated on upcoming studies. Advised on safe driving practices. Encouraged scheduling naps as able.   Patient Instructions  Your sleep study did not reveal any significant sleep apnea. Given your daytime symptoms, we will have you complete an overnight lab study follow by multi sleep latency study, which is a nap test. Someone will contact you to schedule this.  Use caution when driving and pull over if you become sleepy  Schedule a nap in the  afternoon, if able.  Follow up in 8-10 weeks to review test results with Dr. Wynona Neat. If symptoms worsen, please contact office for sooner follow up      I discussed the assessment and treatment plan with the patient. The patient was provided an opportunity to ask questions and all were answered. The patient agreed with the plan and demonstrated an understanding of the instructions.   The patient was advised to call back or seek an in-person evaluation if the symptoms worsen or if the condition fails to improve as anticipated.  I provided 31 minutes of non-face-to-face time during this encounter.   Noemi Chapel, NP

## 2023-03-16 NOTE — Patient Instructions (Signed)
Your sleep study did not reveal any significant sleep apnea. Given your daytime symptoms, we will have you complete an overnight lab study follow by multi sleep latency study, which is a nap test. Someone will contact you to schedule this.  Use caution when driving and pull over if you become sleepy  Schedule a nap in the afternoon, if able.  Follow up in 8-10 weeks to review test results with Dr. Wynona Neat. If symptoms worsen, please contact office for sooner follow up

## 2023-03-26 ENCOUNTER — Other Ambulatory Visit: Payer: Self-pay | Admitting: Neurology

## 2023-03-27 NOTE — Telephone Encounter (Signed)
Rx sent, pls remind patient to have bloodwork done, thanks

## 2023-03-31 ENCOUNTER — Telehealth: Payer: Self-pay

## 2023-03-31 NOTE — Telephone Encounter (Signed)
Patient will have her labs on the same day of her MRI

## 2023-03-31 NOTE — Telephone Encounter (Signed)
LMOVM for patient to call the office.

## 2023-04-06 NOTE — Telephone Encounter (Signed)
Patient had a mychart visit with Katie . Nothing else further needed.

## 2023-04-22 ENCOUNTER — Ambulatory Visit
Admission: RE | Admit: 2023-04-22 | Discharge: 2023-04-22 | Disposition: A | Discharge status: Home / Self Care | Payer: 59 | Source: Ambulatory Visit | Attending: Neurology | Admitting: Neurology

## 2023-04-22 DIAGNOSIS — G500 Trigeminal neuralgia: Secondary | ICD-10-CM

## 2023-04-22 MED ORDER — GADOPICLENOL 0.5 MMOL/ML IV SOLN
10.0000 mL | Freq: Once | INTRAVENOUS | Status: AC | PRN
  Administered 2023-04-22: 10 mL via INTRAVENOUS

## 2023-05-22 ENCOUNTER — Telehealth: Payer: Self-pay

## 2023-05-22 NOTE — Telephone Encounter (Signed)
Patient advised of MRI and labs she will try and come by.

## 2023-05-22 NOTE — Telephone Encounter (Signed)
-----   Message from Cira Servant sent at 05/18/2023  7:03 AM EST ----- MRI does not show any abnormalities or cause for the facial pain.  It may have been set off by the ear infection, in which case I hope it will resolve.  In the meantime, we will continue the carbamazepine to treat the pain.  She still needs to get the labs drawn (CBC and CMP)

## 2023-05-29 ENCOUNTER — Telehealth: Payer: Self-pay | Admitting: Nurse Practitioner

## 2023-05-29 NOTE — Telephone Encounter (Signed)
Sleep study authorization denied by [facility]   Authorization #: [id number] Your doctor is requesting a sleep study in a sleep center. This is because you may have a problem with breathing while sleeping. We have reviewed your health plan medical policy for treatment of sleep study. A sleep study in a sleep center is needed for the following:  Medical problems that would make a home study less accurate  Documented complex sleep disorder  Home sleep test was not accurate  Member cannot perform a home sleep study We have asked for your doctor's notes. The notes we have did not contain the information we require. This request is not approved.Please ask your provider about options for treatment Service(s) not covered:  Procedure code: 82956   Call Peer-to-Peer Support Team at [phone].

## 2023-05-31 NOTE — Telephone Encounter (Signed)
Peer to peer completed.  NPSG/MSLT approved Authorization number [id number] Please schedule pt. Thanks!

## 2023-06-05 ENCOUNTER — Other Ambulatory Visit: Payer: Self-pay

## 2023-06-05 DIAGNOSIS — Z79899 Other long term (current) drug therapy: Secondary | ICD-10-CM

## 2023-06-06 LAB — COMPREHENSIVE METABOLIC PANEL
AG Ratio: 1.6 (calc) (ref 1.0–2.5)
ALT: 18 U/L (ref 6–29)
AST: 17 U/L (ref 10–30)
Albumin: 4.4 g/dL (ref 3.6–5.1)
Alkaline phosphatase (APISO): 54 U/L (ref 31–125)
BUN: 9 mg/dL (ref 7–25)
CO2: 29 mmol/L (ref 20–32)
Calcium: 9.6 mg/dL (ref 8.6–10.2)
Chloride: 102 mmol/L (ref 98–110)
Creat: 0.91 mg/dL (ref 0.50–0.96)
Globulin: 2.7 g/dL (ref 1.9–3.7)
Glucose, Bld: 104 mg/dL — ABNORMAL HIGH (ref 65–99)
Potassium: 4.3 mmol/L (ref 3.5–5.3)
Sodium: 138 mmol/L (ref 135–146)
Total Bilirubin: 0.3 mg/dL (ref 0.2–1.2)
Total Protein: 7.1 g/dL (ref 6.1–8.1)

## 2023-06-06 LAB — CBC
HCT: 39.7 % (ref 35.0–45.0)
Hemoglobin: 13.1 g/dL (ref 11.7–15.5)
MCH: 29.8 pg (ref 27.0–33.0)
MCHC: 33 g/dL (ref 32.0–36.0)
MCV: 90.4 fL (ref 80.0–100.0)
MPV: 10.3 fL (ref 7.5–12.5)
Platelets: 337 10*3/uL (ref 140–400)
RBC: 4.39 10*6/uL (ref 3.80–5.10)
RDW: 12.8 % (ref 11.0–15.0)
WBC: 8 10*3/uL (ref 3.8–10.8)

## 2023-06-06 NOTE — Telephone Encounter (Signed)
It looks like pt has been scheduled for 1/12 & 1/13.

## 2023-06-15 ENCOUNTER — Encounter: Payer: Self-pay | Admitting: Family Medicine

## 2023-06-15 ENCOUNTER — Telehealth: Payer: Self-pay | Admitting: Internal Medicine

## 2023-06-15 NOTE — Telephone Encounter (Signed)
PT called to cancel both (?) Sleep study appts. States there are Issues w/ins Co so PT wants to cancel both future appts.   Also, she had some BW done and it may show why  the fatigue she has been experienced is there.

## 2023-07-16 ENCOUNTER — Encounter (HOSPITAL_BASED_OUTPATIENT_CLINIC_OR_DEPARTMENT_OTHER): Payer: 59 | Admitting: Internal Medicine

## 2023-07-17 ENCOUNTER — Encounter (HOSPITAL_BASED_OUTPATIENT_CLINIC_OR_DEPARTMENT_OTHER): Payer: 59 | Admitting: Internal Medicine

## 2023-07-23 ENCOUNTER — Other Ambulatory Visit: Payer: Self-pay

## 2023-07-23 ENCOUNTER — Encounter (HOSPITAL_COMMUNITY): Payer: Self-pay

## 2023-07-23 ENCOUNTER — Emergency Department (HOSPITAL_COMMUNITY): Payer: 59

## 2023-07-23 ENCOUNTER — Emergency Department (HOSPITAL_COMMUNITY)
Admission: EM | Admit: 2023-07-23 | Discharge: 2023-07-23 | Disposition: A | Discharge status: Home / Self Care | Payer: 59 | Attending: Emergency Medicine | Admitting: Emergency Medicine

## 2023-07-23 DIAGNOSIS — R002 Palpitations: Secondary | ICD-10-CM | POA: Diagnosis not present

## 2023-07-23 DIAGNOSIS — R0789 Other chest pain: Secondary | ICD-10-CM | POA: Insufficient documentation

## 2023-07-23 LAB — CBC
HCT: 44.4 % (ref 36.0–46.0)
Hemoglobin: 14.4 g/dL (ref 12.0–15.0)
MCH: 29.8 pg (ref 26.0–34.0)
MCHC: 32.4 g/dL (ref 30.0–36.0)
MCV: 91.9 fL (ref 80.0–100.0)
Platelets: 318 10*3/uL (ref 150–400)
RBC: 4.83 MIL/uL (ref 3.87–5.11)
RDW: 12.5 % (ref 11.5–15.5)
WBC: 7.3 10*3/uL (ref 4.0–10.5)
nRBC: 0 % (ref 0.0–0.2)

## 2023-07-23 LAB — URINALYSIS, ROUTINE W REFLEX MICROSCOPIC
Bacteria, UA: NONE SEEN
Bilirubin Urine: NEGATIVE
Glucose, UA: NEGATIVE mg/dL
Ketones, ur: NEGATIVE mg/dL
Leukocytes,Ua: NEGATIVE
Nitrite: NEGATIVE
Protein, ur: NEGATIVE mg/dL
Specific Gravity, Urine: 1.013 (ref 1.005–1.030)
pH: 6 (ref 5.0–8.0)

## 2023-07-23 LAB — BASIC METABOLIC PANEL
Anion gap: 10 (ref 5–15)
BUN: 10 mg/dL (ref 6–20)
CO2: 22 mmol/L (ref 22–32)
Calcium: 9.5 mg/dL (ref 8.9–10.3)
Chloride: 104 mmol/L (ref 98–111)
Creatinine, Ser: 0.65 mg/dL (ref 0.44–1.00)
GFR, Estimated: >60 mL/min (ref >60)
Glucose, Bld: 91 mg/dL (ref 70–99)
Potassium: 3.4 mmol/L — ABNORMAL LOW (ref 3.5–5.1)
Sodium: 136 mmol/L (ref 135–145)

## 2023-07-23 LAB — TROPONIN I (HIGH SENSITIVITY)
Troponin I (High Sensitivity): <2 ng/L (ref <18)
Troponin I (High Sensitivity): <2 ng/L (ref <18)

## 2023-07-23 LAB — HCG, SERUM, QUALITATIVE: Preg, Serum: NEGATIVE

## 2023-07-23 MED ORDER — POTASSIUM CHLORIDE CRYS ER 20 MEQ PO TBCR
40.0000 meq | EXTENDED_RELEASE_TABLET | Freq: Once | ORAL | Status: AC
  Administered 2023-07-23: 40 meq via ORAL
  Filled 2023-07-23: qty 2

## 2023-07-23 NOTE — Discharge Instructions (Addendum)
You were seen in the emergency room today for chest pain.  Your workup here, EKG and chest x-ray are reassuring.  Please follow-up with your primary care doctor as discussed.  Since you are having palpitations I would discuss heart monitor.  Please return to emergency room with new or worsening symptoms.  Make sure you are staying well-hydrated primarily with water.  You can alternate Pedialyte and Gatorade as needed.  I would also recommend trying to avoid excessive caffeine intake.

## 2023-07-23 NOTE — ED Triage Notes (Signed)
Pt reports with chest tightness and palpitations x 3 days. Pt also reports a headache today and left sided numbness that started 30 mins ago.

## 2023-07-23 NOTE — ED Provider Notes (Signed)
[place] EMERGENCY DEPARTMENT AT [facility] Provider Note   CSN: [id number] Arrival date & time: 07/23/23  8657     History  Chief Complaint  Patient presents with   Chest Pain    Michelle Hancock is a 23 y.o. female patient with past medical history of idiopathic intracranial hypertension, PCOS, carpal tunnel presenting to emergency room with complaint of chest tightness and palpitations that have been ongoing for 3 days.  Patient reports her chest pain is intermittent and not associated with palpitations.  Palpitations occur that last several seconds.  She reports has never had anything like this in the past.  Did have an episode approximately 30 minutes ago when she felt that her left arm was very cold.  Denies any associated numbness, tingling or weakness.  No history of pulmonary embolism in the past.  Denies any recent travel.  Denies any cold or flu symptoms.   Chest Pain      Home Medications Prior to Admission medications   Medication Sig Start Date End Date Taking? Authorizing Provider  acetaZOLAMIDE ER (DIAMOX) 500 MG capsule Take 1 capsule (500 mg total) by mouth 2 (two) times daily. 02/15/23   Drema Dallas, DO  carbamazepine (TEGRETOL XR) 100 MG 12 hr tablet TAKE 1 TABLET BY MOUTH TWICE A DAY 03/27/23   Van Clines, MD  doxycycline (ADOXA) 100 MG tablet Take 100 mg by mouth daily. Patient not taking: Reported on 03/16/2023 03/24/22   [provider]  meclizine (MEDI-MECLIZINE) 25 MG tablet Take 1 tablet (25 mg total) by mouth every 6 (six) hours as needed for dizziness. 01/23/23   Jaffe, Adam R, DO  TRI-LO-MARZIA 0.18/0.215/0.25 MG-25 MCG tab Take 1 tablet by mouth daily. 08/25/22   [provider]      Allergies    Tacrolimus    Review of Systems   Review of Systems  Cardiovascular:  Positive for chest pain.    Physical Exam Updated Vital Signs BP 121/75 (BP Location: Left Arm)   Pulse 79   Temp 98.1 F (36.7 C) (Oral)   Resp 16    Ht 5\' 7"  (1.702 m)   Wt 112.9 kg   SpO2 100%   BMI 38.98 kg/m  Physical Exam Vitals and nursing note reviewed.  Constitutional:      General: She is not in acute distress.    Appearance: She is not toxic-appearing.  HENT:     Head: Normocephalic and atraumatic.  Eyes:     General: No scleral icterus.    Conjunctiva/sclera: Conjunctivae normal.  Cardiovascular:     Rate and Rhythm: Normal rate and regular rhythm.     Pulses: Normal pulses.     Heart sounds: Normal heart sounds.  Pulmonary:     Effort: Pulmonary effort is normal. No respiratory distress.     Breath sounds: Normal breath sounds.  Abdominal:     General: Abdomen is flat. Bowel sounds are normal.     Palpations: Abdomen is soft.     Tenderness: There is no abdominal tenderness.  Musculoskeletal:     Right lower leg: No edema.     Left lower leg: No edema.     Comments: Strong radial pulse equal bilaterally, strength and sensation of upper extremity equal bilaterally Strong dorsal pedal pulse equal bilaterally, during and sensation of lower extremity equal bilaterally  Skin:    General: Skin is warm and dry.     Capillary Refill: Capillary refill takes less than  2 seconds.     Findings: No lesion.  Neurological:     General: No focal deficit present.     Mental Status: She is alert and oriented to person, place, and time. Mental status is at baseline.     ED Results / Procedures / Treatments   Labs (all labs ordered are listed, but only abnormal results are displayed) Labs Reviewed  BASIC METABOLIC PANEL  CBC  HCG, SERUM, QUALITATIVE  URINALYSIS, ROUTINE W REFLEX MICROSCOPIC  TROPONIN I (HIGH SENSITIVITY)    EKG None  Radiology No results found.  Procedures Procedures    Medications Ordered in ED Medications - No data to display  ED Course/ Medical Decision Making/ A&P Clinical Course as of 07/23/23 1833  Sun Jul 23, 2023  1232 [facility] Negative [JB]    Clinical Course User Index [JB]  Michelle Hancock, Michelle Chestnut, PA-C                                 Medical Decision Making Amount and/or Complexity of Data Reviewed Labs: ordered. Radiology: ordered.  Risk Prescription drug management.   Michelle Hancock 23 y.o. presented today for chest pain. Working DDx that I considered at this time includes, but not limited to, ACS, GERD, pe, pna, aortic dissection, pneumothorax, MSK path, anemia, esophageal rupture, CHF exacerbation, valvular disorder, myocarditis, pericarditis, endocarditis, pericardial effusion/cardiac tamponade, pulmonary edema, gastritis/PUD, esophagitis.  R/o Dx: These are considered less likely due to history of present illness and physical exam findings.   Review of prior external notes: None  Unique Tests and My Interpretation:  EKG: Rate, rhythm, axis, intervals all examined: sinus  Troponin: Negative, repeat negative CXR: No acute pathology HCG negative CBC: No leukocytosis and no anemia BMP: Definite electrolyte abnormality however potassium is 3.4 thus supplemented  Problem List / ED Course / Critical interventions / Medication management  Reporting chest pain.  No sign of DVT or PE on exam.  Patient is PERC negative.  Not endorsing any shortness of breath.  Chest pain with 2 negative troponin as well as reassuring EKG thus doubt ACS as cause.  No pneumonia no pneumothorax on chest x-ray.  Patient is hemodynamically stable and well-appearing.  Patient did not have any episodes of palpitation or chest pain during emergency room stay.  Cardiac monitor without any significant event.  Patient family will follow-up with primary care to discuss cardiac monitor.  Requesting discharge. Patient does not have unilateral calf tenderness or swelling, no sign of DVT and no history of DVT/PE.   Patients vitals assessed. Upon arrival patient is hemodynamically stable.  I have reviewed the patients home medicines and have made adjustments as needed     Plan:  F/u w/ PCP  to ensure resolution of sx.  Patient was given return precautions. Patient stable for discharge at this time.  Patient educated on sx/ dx and verbalized understanding of plan.  Will return to ER w/ new or worsening sx.          Final Clinical Impression(s) / ED Diagnoses Final diagnoses:  Atypical chest pain  Palpitations    Rx / DC Orders ED Discharge Orders     None         Smitty Knudsen, PA-C 07/23/23 1835    Margarita Grizzle, MD 07/27/23 1045

## 2023-07-26 ENCOUNTER — Ambulatory Visit: Payer: 59 | Attending: Family Medicine

## 2023-07-26 ENCOUNTER — Encounter: Payer: Self-pay | Admitting: Family Medicine

## 2023-07-26 ENCOUNTER — Ambulatory Visit: Payer: 59 | Admitting: Family Medicine

## 2023-07-26 VITALS — BP 110/76 | HR 100 | Temp 98.0°F | Resp 16 | Ht 67.0 in | Wt 251.4 lb

## 2023-07-26 DIAGNOSIS — R439 Unspecified disturbances of smell and taste: Secondary | ICD-10-CM | POA: Diagnosis not present

## 2023-07-26 DIAGNOSIS — R739 Hyperglycemia, unspecified: Secondary | ICD-10-CM | POA: Diagnosis not present

## 2023-07-26 DIAGNOSIS — R002 Palpitations: Secondary | ICD-10-CM | POA: Diagnosis not present

## 2023-07-26 LAB — TSH: TSH: 1.52 u[IU]/mL (ref 0.35–5.50)

## 2023-07-26 LAB — HEMOGLOBIN A1C: Hgb A1c MFr Bld: 5.5 % (ref 4.6–6.5)

## 2023-07-26 LAB — MAGNESIUM: Magnesium: 1.9 mg/dL (ref 1.5–2.5)

## 2023-07-26 NOTE — Progress Notes (Unsigned)
EP to read

## 2023-07-26 NOTE — Progress Notes (Signed)
Chief Complaint  Patient presents with   Follow-up    Follow up ER     Michelle Hancock is a 23 y.o. female here for palpitations.  Length of issue: 1 week How long does it last: a few seconds Light headedness/passing out? No Chest pain/shortness of breath? Yes- improved, went to ED  Hx of arrhythmia? No Medication changes/illicit substances? No Eating and drinking normally. No stress associated w episodes.   The patient describes several months of an intermittent fruity taste in her mouth.  No obvious food or beverage triggers to this.  It does not last very long.  There is nothing in her mouth.  She has chronic congestion from allergies.  She uses Flonase intermittently.  She does follow with the neurology team for IIH.  MRI done on 09/14/2022 was unremarkable.  No neuro signs or symptoms.  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 If you have any questions, ask your nurse or doctor.          STOP taking these medications    acetaZOLAMIDE ER 500 MG capsule Commonly known as: DIAMOX Stopped by: [name] Quincy Prisco   carbamazepine 100 MG 12 hr tablet Commonly known as: TEGRETOL XR Stopped by: [name] Quamir Willemsen   meclizine 25 MG tablet Commonly known as: Medi-Meclizine Stopped by: [name] Michael Walrath   Tri-Lo-Marzia 0.18/0.215/0.25 MG-25 MCG Tabs Generic drug: Norgestimate-Eth Estradiol Stopped by: [name] Morse Brueggemann       TAKE these  medications    doxycycline 100 MG tablet Commonly known as: ADOXA Take 100 mg by mouth daily.        BP 110/76   Pulse 100   Temp 98 F (36.7 C) (Oral)   Resp 16   Ht 5\' 7"  (1.702 m)   Wt 251 lb 6.4 oz (114 kg)   SpO2 96%   BMI 39.37 kg/m  Gen: Awake, alert, appearing stated age Eyes: PERRLA Nose: Nares are patent without rhinorrhea Mouth: MMM Heart: RRR, no bruits, no LE edema Lungs: CTAB, no accessory muscle use Neuro: No cerebellar signs MSK: No muscle group atrophy or asymmetry Psych: Age appropriate judgment and insight, mood/affect WNL  Palpitations - Plan: TSH, Magnesium, LONG TERM MONITOR (3-14 DAYS)  Hyperglycemia - Plan: Hemoglobin A1c  Disorder of taste  ER visit reviewed.  Went over labs.  Check thyroid and magnesium levels.  7-day Zio patch ordered.  Probably PVCs but nothing obvious now.  Shortness of breath episodes have improved, no chest pain.  Stay hydrated.  Avoid excessive caffeine. Ck A1c, has a famhx. Flonase daily for 60 d. Reach out to neuro office to ensure nothing concerning for their end. If Neuro gives OK and Flonase unhelpful, would have her reach out to her ENT thru [facility]. Orders as above. The patient voiced understanding and agreement to  the plan.  I spent 31 min w pt discussing the above plans in addition to reviewing her chart on the same day of the visit.   [name] Argyle Gustafson 9:23 AM 07/26/23

## 2023-07-26 NOTE — Patient Instructions (Signed)
Give [place] 2-3 business days to get the results of your labs back.   Stay hydrated.  Someone will reach out regarding your monitor.   Use Flonase daily for 60 days and see if it affects your taste. I also want you to bring this up with Dr. Everlena Cooper.  Let us know if you need anything.

## 2023-08-06 ENCOUNTER — Encounter: Payer: Self-pay | Admitting: Neurology

## 2023-08-07 ENCOUNTER — Telehealth: Payer: Self-pay

## 2023-08-07 DIAGNOSIS — R438 Other disturbances of smell and taste: Secondary | ICD-10-CM

## 2023-08-07 DIAGNOSIS — D1800 Hemangioma unspecified site: Secondary | ICD-10-CM

## 2023-08-07 NOTE — Telephone Encounter (Signed)
That would typically not be due to a primary neurologic cause.  For neurologic workup, I would order MRI of brain with and without contrast and routine EEG (for cavernoma and phantogeusia)

## 2023-08-07 NOTE — Telephone Encounter (Signed)
Per Dr.Jaffe, That would typically not be due to a primary neurologic cause.  For neurologic workup, I would order MRI of brain with and without contrast and routine EEG (for cavernoma and phantogeusia)

## 2023-08-21 NOTE — Progress Notes (Deleted)
 Virtual Visit via Video Note  Consent was obtained for video visit:  Yes.   Answered questions that patient had about telehealth interaction:  Yes.   I discussed the limitations, risks, security and privacy concerns of performing an evaluation and management service by telemedicine. I also discussed with the patient that there may be a patient responsible charge related to this service. The patient expressed understanding and agreed to proceed.  Pt location: Home Physician Location: office Name of referring provider:  Sharlene Hancock* I connected with Michelle Hancock at patients initiation/request on 08/22/2023 at  3:30 PM EST by video enabled telemedicine application and verified that I am speaking with the correct person using two identifiers. Pt MRN:  [id number] Pt DOB:  01/11/2001 Video Participants:  Michelle Hancock  Assessment/Plan:   Left sided trigeminal neuralgia, unclear if secondary to ear infection. 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 Cavernous angioma, stable    Start carbamazepine 100mg  twice daily.  Check CBC and CMP. Check MRI of left trigeminal nerve with and without contrast Hold off on starting acetazolamide for now.       Subjective:  Michelle Hancock is a 23 year old female with PCOS who follows up for lightheadedness.    UPDATE: For trigeminal neuralgia, started carbamazepine.  MRI of trigeminal nerves with and without contrast on 04/22/2023 personally reviewed showed stably small left Meckel's cave but no abnormalities that would contribute to trigeminal neuralgia.    Since ***, she has noticed tasting a fruity taste in her mouth and throat, like she had been chewing fruit-flavored gum.  Occurs randomly.  Blood work for diabetes was negative.    06/05/2023 LABS:  CBC with WBC 8, HGB 13.1, HCT 39.7, PLT 337; CMP with Na 138, K 4.3, Cl 102, CO2 29, glucose 104, Ca 9.6, BUN 9, Cr 0.91, t bili 0.3, ALP 54, AST 17, ALT 18    HISTORY: She reports episodes of dizziness since 2022.  Occurs spontaneously, not just positional.  Sudden lightheadedness with blurred vision and nausea.  Sometimes may note palpitations.  Initially lasts 2 minutes but more recent episode lasted 15 minutes.  Felt fatigued afterwards.  No associated headache, speech disturbance, photophobia, phonophobia, loss of consciousness, numbness or weakness.  They are not frequent, maybe 8 to 10 episodes over a year.  Since a few weeks ago, she also notes that she is more forgetful.  If she is talking, her mind "goes blank" - some slurring of words and putting sentences together.  For example, she started having difficulty giving the same speech at work that she has given for years.  One time when she left the house, she noticed a car parked at the end of her driveway.  When she got in her car, she forgot the car was there and she backed up into the car.  MRI of brain on 11/30/2021 showed constellation of findings that may be seen in idiopathic intracranial hypertension such as partially empty sella and mild symmetric CSF prominence within opti nerve sheaths.  She was seen by ophthalmologist Dr. 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  Despite normal eye exam, she underwent lumbar puncture on 04/22/2022, which revealed an opening pressure of 33 cm water.  MRV of head on 09/14/2022 personally reviewed was normal.  On 02/17/2023, she developed "Swimmer's Ear" after getting  [place] in [place] the beach.  She has had history of recurrent ear infections and felt her left ear was stuffed up.  She saw her ENT on 8/21 and strated on amoxicillin and oflaxcin drops.  The fullness in her left ear has not dissipated.  However, she started experiencing numbness and pins and needles sensation along the left jaw line and left perioral region which gradually  spread to involve the entire left V2-V3 distribution.  She also developed sharp aching pain in the left side of her lower face and in her throat when she swallows.  Pain is also aggravated by yawning and chewing.  No ear pain or discharge.  Hearing is muffled in the left ear.  No facial droop or involvement of extremities.  Around the same time, she has been having headaches.  Notices it when she bends over and sits up or stands up from a chair.  A pressure across the forehead.  Lasts 30 seconds.  If she is bent over and stands back up, she may briefly have dimming of vision.  No pulsatile tinnitus.  Headaches improved with antibiotics.     She previously was seen by neurology in 2020 when she was experiencing recurrent episodes of left sided face, arm and leg numbness and pain with left arm and leg weakness lasting several minutes.  She was seen in the ED on several occasions for these spells.  MRI of the brain on 11/11/2018 showed probable small cavernous angioma in the right frontal lobe but otherwise unremarkable.  She subsequently had an EEG which was normal.  NCV-EMG revealed moderate left and mld right carpal tunnel syndrome.  She subsequently underwent left carpal tunnel release in December 2020.  These spells subsequently resolved.  In 2023, she started experiencing headaches, right sided pressure-like and throbbing headache lasting a few minutes without nausea, vomiting, photophobia, phonophobia, visual disturbance, numbness, weakness or autonomic symptoms.  CT head on 09/06/2019 was normal.  Headaches subsequently resolved.    Underwent repeat testing with me: 11/04/2021 LABS:  B12 504, TSH 1.59 11/24/2021 EEG:  Normal awake and asleep EEG.  Patient exhibited one of her habitual spells that did not reveal an electrographic correlate.   11/30/2021 MRI BRAIN W WO:  Partially empty sella turcica, new from the prior MRI of 11/11/2018.  Mild symmetric CSF prominence within the optic nerve sheaths. This  constellation of findings can be seen in the setting of idiopathic intracranial hypertension (pseudotumor cerebri), and this diagnosis should be considered given the provided history of blurred vision. Consider correlation with CSF opening pressure. Unchanged subcentimeter focus of susceptibility-weighted signal loss within the mid-to-posterior right frontal lobe (within an adjacent small developmental venous anomaly), likely reflecting a cavernoma. 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   No facility-administered encounter medications on file as of 08/22/2023.    Allergies: Allergies  Allergen Reactions   Tacrolimus Hives    Family History: Family History  Problem Relation Age of Onset  Allergies Father    Migraines Father    Allergic rhinitis Father    Gestational diabetes Mother    Hypertension Mother    Hyperlipidemia Mother    Breast cancer Maternal Aunt    Heart attack Maternal Grandmother     Observations/Objective:   *** No acute distress.  Alert and oriented.  Speech fluent and not dysarthric.  Language intact.  Eyes orthophoric on primary gaze.  Face symmetric.   Follow Up Instructions:    -I discussed the assessment and treatment plan with the patient. The patient was provided an opportunity to ask questions and all were answered. The patient agreed with the plan and demonstrated an understanding of the instructions.   The patient was advised to call back  or seek an in-person evaluation if the symptoms worsen or if the condition fails to improve as anticipated.   Cira Servant, DO

## 2023-08-22 ENCOUNTER — Telehealth: Payer: 59 | Admitting: Neurology

## 2023-08-23 NOTE — Progress Notes (Unsigned)
Virtual Visit via Video Note  Consent was obtained for video visit:  Yes.   Answered questions that patient had about telehealth interaction:  Yes.   I discussed the limitations, risks, security and privacy concerns of performing an evaluation and management service by telemedicine. I also discussed with the patient that there may be a patient responsible charge related to this service. The patient expressed understanding and agreed to proceed.  Pt location: Home Physician Location: office Name of referring provider:  Sharlene Dory* I connected with Michelle Hancock at patients initiation/request on 08/25/2023 at  1:30 PM EST by video enabled telemedicine application and verified that I am speaking with the correct person using two identifiers. 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  Will check for vitamin deficiencies. Left sided trigeminal neuralgia, likely secondary to ear infection. 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 Cavernous angioma, stable    For further evaluation of phantogeusia: Check B12 and Zinc Routine EEG MRI of brain with and without contrast already scheduled Follow up 6 months.   Subjective:  Michelle Hancock is a 23 year old female with PCOS who follows up for lightheadedness.    UPDATE: For trigeminal neuralgia, started carbamazepine.  MRI of trigeminal nerves with and without contrast on 04/22/2023 personally reviewed showed stably small left Meckel's cave but no abnormalities that would contribute to trigeminal neuralgia.  Symptoms resolved after resolution of ear injection, so carbamazepine was discontinued.   For over a year, she has noticed tasting a fruity taste in her mouth and throat, like she had been chewing fruit-flavored gum.  Occurs randomly.  Blood work for diabetes was negative.  Has been  following up with her dentist every 6 months with clean bill of health.    06/05/2023 LABS:  CBC with WBC 8, HGB 13.1, HCT 39.7, PLT 337; CMP with Na 138, K 4.3, Cl 102, CO2 29, glucose 104, Ca 9.6, BUN 9, Cr 0.91, t bili 0.3, ALP 54, AST 17, ALT 18   HISTORY: She reports episodes of dizziness since 2022.  Occurs spontaneously, not just positional.  Sudden lightheadedness with blurred vision and nausea.  Sometimes may note palpitations.  Initially lasts 2 minutes but more recent episode lasted 15 minutes.  Felt fatigued afterwards.  No associated headache, speech disturbance, photophobia, phonophobia, loss of consciousness, numbness or weakness.  They are not frequent, maybe 8 to 10 episodes over a year.  Since a few weeks ago, she also notes that she is more forgetful.  If she is talking, her mind "goes blank" - some slurring of words and putting sentences together.  For example, she started having difficulty giving the same speech at work that she has given for years.  One time when she left the house, she noticed a car parked at the end of her driveway.  When she got in her car, she forgot the car was there and she backed up into the car.  MRI of brain on 11/30/2021 showed constellation of findings that may be seen in idiopathic intracranial hypertension such as partially empty sella and mild symmetric CSF prominence within opti nerve sheaths.  She was seen by ophthalmologist Dr. 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  Despite normal eye exam, she underwent lumbar puncture on 04/22/2022, which revealed an opening pressure of 33 cm water.  MRV of head on 09/14/2022 personally reviewed was normal.  On 02/17/2023, she developed "Swimmer's Ear" after getting [place] in [place] at the beach.  She has had history of recurrent ear infections and felt her left ear was stuffed up.  She saw  her ENT on 8/21 and strated on amoxicillin and oflaxcin drops.  The fullness in her left ear has not dissipated.  However, she started experiencing numbness and pins and needles sensation along the left jaw line and left perioral region which gradually spread to involve the entire left V2-V3 distribution.  She also developed sharp aching pain in the left side of her lower face and in her throat when she swallows.  Pain is also aggravated by yawning and chewing.  No ear pain or discharge.  Hearing is muffled in the left ear.  No facial droop or involvement of extremities.  Around the same time, she has been having headaches.  Notices it when she bends over and sits up or stands up from a chair.  A pressure across the forehead.  Lasts 30 seconds.  If she is bent over and stands back up, she may briefly have dimming of vision.  No pulsatile tinnitus.  Headaches improved with antibiotics.     She previously was seen by neurology in 2020 when she was experiencing recurrent episodes of left sided face, arm and leg numbness and pain with left arm and leg weakness lasting several minutes.  She was seen in the ED on several occasions for these spells.  MRI of the brain on 11/11/2018 showed probable small cavernous angioma in the right frontal lobe but otherwise unremarkable.  She subsequently had an EEG which was normal.  NCV-EMG revealed moderate left and mld right carpal tunnel syndrome.  She subsequently underwent left carpal tunnel release in December 2020.  These spells subsequently resolved.  In 2023, she started experiencing headaches, right sided pressure-like and throbbing headache lasting a few minutes without nausea, vomiting, photophobia, phonophobia, visual disturbance, numbness, weakness or autonomic symptoms.  CT head on 09/06/2019 was normal.  Headaches subsequently resolved.    Underwent repeat testing with me: 11/04/2021 LABS:  B12 504, TSH 1.59 11/24/2021 EEG:  Normal awake and asleep EEG.  Patient  exhibited one of her habitual spells that did not reveal an electrographic correlate.   11/30/2021 MRI BRAIN W WO:  Partially empty sella turcica, new from the prior MRI of 11/11/2018.  Mild symmetric CSF prominence within the optic nerve sheaths. This constellation of findings can be seen in the setting of idiopathic intracranial hypertension (pseudotumor cerebri), and this diagnosis should be considered given the provided history of blurred vision. Consider correlation with CSF opening pressure. Unchanged subcentimeter focus of susceptibility-weighted signal loss within the mid-to-posterior right frontal lobe (within an adjacent small developmental venous anomaly), likely reflecting a cavernoma. 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   No facility-administered encounter medications on file as of 08/25/2023.    Allergies: Allergies  Allergen Reactions   Tacrolimus Hives    Family History: Family History  Problem Relation Age of Onset   Allergies Father    Migraines Father    Allergic rhinitis Father    Gestational diabetes Mother    Hypertension Mother    Hyperlipidemia Mother    Breast cancer Maternal Aunt    Heart attack Maternal Grandmother     Observations/Objective:   No acute distress.  Alert and oriented.  Speech fluent and not dysarthric.  Language intact.    Follow Up Instructions:    -I discussed the assessment and treatment plan with the patient. The  patient was provided an opportunity to ask questions and all were answered. The patient agreed with the plan and demonstrated an understanding of the instructions.   The patient was advised to call back or seek an in-person evaluation if the symptoms worsen or if the condition fails to improve as anticipated.   Cira Servant, DO

## 2023-08-25 ENCOUNTER — Telehealth (INDEPENDENT_AMBULATORY_CARE_PROVIDER_SITE_OTHER): Payer: 59 | Admitting: Neurology

## 2023-08-25 ENCOUNTER — Encounter: Payer: Self-pay | Admitting: Neurology

## 2023-08-25 DIAGNOSIS — G932 Benign intracranial hypertension: Secondary | ICD-10-CM

## 2023-08-25 DIAGNOSIS — R438 Other disturbances of smell and taste: Secondary | ICD-10-CM | POA: Diagnosis not present

## 2023-09-03 ENCOUNTER — Ambulatory Visit
Admission: RE | Admit: 2023-09-03 | Discharge: 2023-09-03 | Disposition: A | Discharge status: Home / Self Care | Payer: 59 | Source: Ambulatory Visit | Attending: Neurology | Admitting: Neurology

## 2023-09-03 DIAGNOSIS — D1800 Hemangioma unspecified site: Secondary | ICD-10-CM

## 2023-09-03 DIAGNOSIS — R438 Other disturbances of smell and taste: Secondary | ICD-10-CM

## 2023-09-03 MED ORDER — GADOPICLENOL 0.5 MMOL/ML IV SOLN
10.0000 mL | Freq: Once | INTRAVENOUS | Status: AC | PRN
  Administered 2023-09-03: 10 mL via INTRAVENOUS

## 2023-09-07 ENCOUNTER — Encounter: Payer: Self-pay | Admitting: Family Medicine

## 2023-09-15 NOTE — Telephone Encounter (Signed)
 Patient send monitor back and cardiology stated that they wouldn't read it she will have to send back to company and the company will send results to PCP.

## 2023-09-29 ENCOUNTER — Telehealth: Payer: Self-pay

## 2023-09-29 ENCOUNTER — Other Ambulatory Visit

## 2023-09-29 ENCOUNTER — Ambulatory Visit: Payer: 59 | Admitting: Neurology

## 2023-09-29 DIAGNOSIS — R438 Other disturbances of smell and taste: Secondary | ICD-10-CM

## 2023-09-29 DIAGNOSIS — D1800 Hemangioma unspecified site: Secondary | ICD-10-CM | POA: Diagnosis not present

## 2023-09-29 NOTE — Procedures (Signed)
 ELECTROENCEPHALOGRAM REPORT  Date of Study: 09/29/2023  Patient's Name: Michelle Hancock MRN: [id number] Date of Birth: October 24, 2000  Clinical History: 24 year old female with IIH and small right frontal cavernoma with phantogeusia.  Medications: none   Technical Summary: A multichannel digital EEG recording measured by the international 10-20 system with electrodes applied with paste and impedances below 5000 ohms performed in our laboratory with EKG monitoring in an awake patient.  Photic stimulation was performed.  The digital EEG was referentially recorded, reformatted, and digitally filtered in a variety of bipolar and referential montages for optimal display.    Description: The patient is awake during the recording.  During maximal wakefulness, there is a symmetric, medium voltage 10 Hz posterior dominant rhythm that attenuates with eye opening.  The record is symmetric.  Stage 2 sleep was not seen.  Photic stimulation did not elicit any abnormalities.  There were no epileptiform discharges or electrographic seizures seen.    EKG lead was unremarkable.  Impression: This awake EEG is normal.    Clinical Correlation: A normal EEG does not exclude a clinical diagnosis of epilepsy.  If further clinical questions remain, prolonged EEG may be helpful.  Clinical correlation is advised.   Shon Millet, DO

## 2023-09-29 NOTE — Progress Notes (Signed)
 Patient advised of her results.

## 2023-09-29 NOTE — Progress Notes (Signed)
 EEG complete - results pending

## 2023-09-29 NOTE — Telephone Encounter (Signed)
 Patient seen for EEG today. Needed labs printed from her last Virtual visit.

## 2023-10-03 LAB — VITAMIN B12: Vitamin B-12: 498 pg/mL (ref 200–1100)

## 2023-10-03 LAB — ZINC: Zinc: 77 ug/dL (ref 60–130)

## 2023-10-04 ENCOUNTER — Encounter: Payer: Self-pay | Admitting: Neurology

## 2024-03-11 ENCOUNTER — Ambulatory Visit: Payer: 59 | Admitting: Neurology

## 2024-04-25 ENCOUNTER — Ambulatory Visit: Payer: Self-pay | Admitting: Family Medicine

## 2024-04-25 NOTE — Telephone Encounter (Signed)
 FYI Only or Action Required?: FYI only for provider.  Patient was last seen in primary care on 07/26/2023 by Frann Mabel Mt, DO.  Called Nurse Triage reporting Chest Pain.  Symptoms began several days ago.  Triage Disposition: Go to ED Now (Notify PCP)  Patient/caregiver understands and will follow disposition?: Yes          Copied from CRM [id number]. Topic: Clinical - Red Word Triage >> Apr 25, 2024  3:03 PM Mercedes MATSU wrote: Red Word that prompted transfer to Nurse Triage: Patient called in stating that she has been having on and off chest pain for the past 3-5 days. Patient states she is now actively having chest pain and wants to know what she should do. Reason for Disposition  [1] Chest pain (or angina) comes and goes AND [2] is happening more often (increasing in frequency) or getting worse (increasing in severity)  (Exception: Chest pains that last only a few seconds.)  Answer Assessment - Initial Assessment Questions This RN recommends pt goes to ED and has another adult drive pt there. Pt agreeable.  Chest pain in middle of chest, right behind sternum Intermittent throughout day and evening for 3-5 days 5/10 pain level   RADIATION: Does the pain go anywhere else? (e.g., into neck, jaw, arms, back)     Denies  DURATION: How long does it last (e.g., seconds, minutes, hours)     Can't put a duration on it; usually does last greater than 5 mins  CARDIAC RISK FACTORS: Do you have any history of heart problems or risk factors for heart disease? (e.g., angina, prior heart attack; diabetes, high blood pressure, high cholesterol, smoker, or strong family history of heart disease)     Denies; pt states she did have heart palpitations in the past and wore a heart monitor  PULMONARY RISK FACTORS: Do you have any history of lung disease?  (e.g., blood clots in lung, asthma, emphysema, birth control pills)     No  CAUSE: What do you think is causing the  chest pain?     No  OTHER SYMPTOMS: Do you have any other symptoms? (e.g., dizziness, nausea, vomiting, sweating, fever, difficulty breathing, cough)       Denies  Pt states her menstrual cycle is coming on and isn't sure if this could be related  Protocols used: Chest Pain-A-AH

## 2024-04-25 NOTE — Telephone Encounter (Signed)
FYI. Pt going to ED.  

## 2024-05-21 ENCOUNTER — Emergency Department (HOSPITAL_BASED_OUTPATIENT_CLINIC_OR_DEPARTMENT_OTHER)
Admission: EM | Admit: 2024-05-21 | Discharge: 2024-05-21 | Disposition: A | Discharge status: Home / Self Care | Attending: Emergency Medicine | Admitting: Emergency Medicine

## 2024-05-21 ENCOUNTER — Encounter (HOSPITAL_BASED_OUTPATIENT_CLINIC_OR_DEPARTMENT_OTHER): Payer: Self-pay

## 2024-05-21 ENCOUNTER — Emergency Department (HOSPITAL_BASED_OUTPATIENT_CLINIC_OR_DEPARTMENT_OTHER)

## 2024-05-21 ENCOUNTER — Other Ambulatory Visit: Payer: Self-pay

## 2024-05-21 DIAGNOSIS — R0602 Shortness of breath: Secondary | ICD-10-CM | POA: Diagnosis not present

## 2024-05-21 DIAGNOSIS — R000 Tachycardia, unspecified: Secondary | ICD-10-CM | POA: Diagnosis not present

## 2024-05-21 DIAGNOSIS — R079 Chest pain, unspecified: Secondary | ICD-10-CM | POA: Diagnosis present

## 2024-05-21 LAB — CBC
HCT: 41.1 % (ref 36.0–46.0)
Hemoglobin: 13.8 g/dL (ref 12.0–15.0)
MCH: 30 pg (ref 26.0–34.0)
MCHC: 33.6 g/dL (ref 30.0–36.0)
MCV: 89.3 fL (ref 80.0–100.0)
Platelets: 378 K/uL (ref 150–400)
RBC: 4.6 MIL/uL (ref 3.87–5.11)
RDW: 13.2 % (ref 11.5–15.5)
WBC: 8.6 K/uL (ref 4.0–10.5)
nRBC: 0 % (ref 0.0–0.2)

## 2024-05-21 LAB — D-DIMER, QUANTITATIVE: D-Dimer, Quant: 0.42 ug{FEU}/mL (ref 0.00–0.50)

## 2024-05-21 LAB — BASIC METABOLIC PANEL WITH GFR
Anion gap: 12 (ref 5–15)
BUN: 15 mg/dL (ref 6–20)
CO2: 26 mmol/L (ref 22–32)
Calcium: 9.6 mg/dL (ref 8.9–10.3)
Chloride: 101 mmol/L (ref 98–111)
Creatinine, Ser: 0.88 mg/dL (ref 0.44–1.00)
GFR, Estimated: >60 mL/min (ref >60)
Glucose, Bld: 134 mg/dL — ABNORMAL HIGH (ref 70–99)
Potassium: 4.3 mmol/L (ref 3.5–5.1)
Sodium: 139 mmol/L (ref 135–145)

## 2024-05-21 LAB — PREGNANCY, URINE: Preg Test, Ur: NEGATIVE

## 2024-05-21 LAB — TROPONIN T, HIGH SENSITIVITY
Troponin T High Sensitivity: <15 ng/L (ref 0–19)
Troponin T High Sensitivity: <15 ng/L (ref 0–19)

## 2024-05-21 MED ORDER — ONDANSETRON HCL 4 MG/2ML IJ SOLN
4.0000 mg | Freq: Once | INTRAMUSCULAR | Status: AC
  Administered 2024-05-21: 4 mg via INTRAVENOUS
  Filled 2024-05-21: qty 2

## 2024-05-21 MED ORDER — SODIUM CHLORIDE 0.9 % IV BOLUS
500.0000 mL | Freq: Once | INTRAVENOUS | Status: AC
  Administered 2024-05-21: 500 mL via INTRAVENOUS

## 2024-05-21 MED ORDER — MORPHINE SULFATE (PF) 4 MG/ML IV SOLN
4.0000 mg | Freq: Once | INTRAVENOUS | Status: AC
  Administered 2024-05-21: 4 mg via INTRAVENOUS
  Filled 2024-05-21: qty 1

## 2024-05-21 MED ORDER — ONDANSETRON 4 MG PO TBDP
4.0000 mg | ORAL_TABLET | Freq: Once | ORAL | Status: AC
  Administered 2024-05-21: 4 mg via ORAL
  Filled 2024-05-21: qty 1

## 2024-05-21 MED ORDER — KETOROLAC TROMETHAMINE 30 MG/ML IJ SOLN
30.0000 mg | Freq: Once | INTRAMUSCULAR | Status: AC
  Administered 2024-05-21: 30 mg via INTRAVENOUS
  Filled 2024-05-21: qty 1

## 2024-05-21 NOTE — ED Provider Notes (Signed)
 [place] [facility] Provider Note   CSN: [id number] Arrival date & time: 05/21/24  1438     Patient presents with: Chest Pain   Michelle Hancock is a 23 y.o. female presents today for mid chest pain that radiates to her back x 2 days.  Patient also reports it is worse with deep inhalation and dyspnea on exertion.  Patient denies cough, congestion, fever, chills, nausea, vomiting, numbness, weakness, recent surgery or travel, injury, or hormone use.    Chest Pain Associated symptoms: shortness of breath        Prior to Admission medications   Not on File    Allergies: Tacrolimus    Review of Systems  Respiratory:  Positive for shortness of breath.   Cardiovascular:  Positive for chest pain.    Updated Vital Signs BP 108/75   Pulse 79   Temp 97.7 F (36.5 C)   Resp 15   LMP 05/07/2024   SpO2 100%   Physical Exam Vitals and nursing note reviewed.  Constitutional:      General: She is not in acute distress.    Appearance: She is well-developed. She is obese. She is not toxic-appearing.     Comments: Uncomfortable appearing  HENT:     Head: Normocephalic and atraumatic.  Eyes:     Conjunctiva/sclera: Conjunctivae normal.  Cardiovascular:     Rate and Rhythm: Regular rhythm. Tachycardia present.     Heart sounds: Normal heart sounds. No murmur heard. Pulmonary:     Effort: Pulmonary effort is normal. Tachypnea present. No respiratory distress.     Breath sounds: Normal breath sounds. No decreased breath sounds, wheezing or rales.  Abdominal:     Palpations: Abdomen is soft.     Tenderness: There is no abdominal tenderness.  Musculoskeletal:        General: No swelling.     Cervical back: Neck supple.     Right lower leg: No edema.     Left lower leg: No edema.  Skin:    General: Skin is warm and dry.     Capillary Refill: Capillary refill takes less than 2 seconds.  Neurological:     General: No focal deficit present.      Mental Status: She is alert and oriented to person, place, and time.  Psychiatric:        Mood and Affect: Mood normal.     (all labs ordered are listed, but only abnormal results are displayed) Labs Reviewed  BASIC METABOLIC PANEL WITH GFR - Abnormal; Notable for the following components:      Result Value   Glucose, Bld 134 (*)    All other components within normal limits  CBC  PREGNANCY, URINE  D-DIMER, QUANTITATIVE  TROPONIN T, HIGH SENSITIVITY  TROPONIN T, HIGH SENSITIVITY    EKG: EKG Interpretation Date/Time:  Tuesday May 21 2024 14:46:28 EST Ventricular Rate:  103 PR Interval:  153 QRS Duration:  70 QT Interval:  330 QTC Calculation: 432 R Axis:   53  Text Interpretation: Sinus tachycardia Borderline low voltage, extremity leads Confirmed by Lenor Hollering [phone]) on 05/21/2024 3:58:26 PM  Radiology: [facility] Chest 2 View Result Date: 05/21/2024 CLINICAL DATA:  Chest pain EXAM: CHEST - 2 VIEW COMPARISON:  Chest x-ray 07/23/2023 FINDINGS: The heart size and mediastinal contours are within normal limits. Both lungs are clear. The visualized skeletal structures are unremarkable. IMPRESSION: No active cardiopulmonary disease. Electronically Signed   By: Greig Maple HERO.D.  On: 05/21/2024 15:21     Procedures   Medications Ordered in the ED  morphine (PF) 4 MG/ML injection 4 mg (4 mg Intravenous Given 05/21/24 1529)  ondansetron (ZOFRAN) injection 4 mg (4 mg Intravenous Given 05/21/24 1529)  sodium chloride  0.9 % bolus 500 mL (0 mLs Intravenous Stopped 05/21/24 1725)  ketorolac  (TORADOL ) 30 MG/ML injection 30 mg (30 mg Intravenous Given 05/21/24 1600)                                    Medical Decision Making Amount and/or Complexity of Data Reviewed Labs: ordered. Radiology: ordered.   This patient presents to the ED for concern of chest pain and shortness of breath, this involves an extensive number of treatment options, and is a complaint that carries  with it a high risk of complications and morbidity.  The differential diagnosis includes STEMI, NSTEMI, arrhythmia, anemia, electrolyte abnormality, anxiety, GERD, PE, CHF, costochondritis, musculoskeletal pain   Co morbidities / Chronic conditions that complicate the patient evaluation  PCOS   Additional history obtained:  Additional history obtained from EMR External records from outside source obtained and reviewed including Care Everywhere   Lab Tests:  I Ordered, and personally interpreted labs.  The pertinent results include: CBC unremarkable, BMP unremarkable, delta troponin less than 15, d-dimer 0.42, negative pregnancy   Imaging Studies ordered:  I ordered imaging studies including chest x-ray I independently visualized and interpreted imaging which showed no active cardiopulmonary disease I agree with the radiologist interpretation   Cardiac Monitoring: / EKG:  The patient was maintained on a cardiac monitor.  I personally viewed and interpreted the cardiac monitored which showed an underlying rhythm of: Sinus tachycardia   Problem List / ED Course / Critical interventions / Medication management I ordered medication including morphine, Zofran, IVF, toradol  Reevaluation of the patient after these medicines showed that the patient symptom improvement I have reviewed the patients home medicines and have made adjustments as needed  Test / Admission - Considered:  Considered for admission or further workup however patient's vital signs, physical exam, labs, and imaging are reassuring.  Patient's symptoms likely due to musculoskeletal pain or costochondritis.  Patient advised to alternate Tylenol  Motrin  as needed for pain.  Patient given return precautions.  I feel patient safe for discharge at this time.     Final diagnoses:  Chest pain, unspecified type    ED Discharge Orders     None          Francis Ileana SAILOR, PA-C 05/21/24 1816    Lenor Hollering,  MD 05/21/24 1958

## 2024-05-21 NOTE — ED Triage Notes (Signed)
 Reports mid chest pain that chest pain that radiates to back x 2 days. Reports that pain worsens with inhalation. Endorses dyspnea with exertion. Denies cough/recent travel/surgeries

## 2024-05-21 NOTE — Discharge Instructions (Addendum)
 Today you were seen for chest pain.  Your workup while in the emergency department was reassuring.  I suspect your symptoms are likely due to inflammation from costochondritis.  Please alternate Tylenol  and Motrin  every 4 hours for pain.  Please follow-up with your primary care if her symptoms persist for further evaluation and workup.  Thank you for letting us  treat you today. After reviewing your labs and imaging, I feel you are safe to go home. Please follow up with your PCP in the next several days and provide them with your records from this visit. Return to the Emergency Room if pain becomes severe or symptoms worsen.

## 2024-05-22 ENCOUNTER — Telehealth: Payer: Self-pay

## 2024-05-22 NOTE — Telephone Encounter (Signed)
 Called spoke with Pt and appt schedule for Monday.

## 2024-05-22 NOTE — Telephone Encounter (Signed)
 Copied from CRM #[id number]. Topic: Appointments - Appointment Scheduling >> May 22, 2024 10:19 AM Franky GRADE wrote: Patient/patient representative is calling to schedule an appointment. Refer to attachments for appointment information.  Patient was scheduled for an ED follow up on Dec 1st, patient added to cancellation list; however, she wanted to know if Mabel Europe can make an exception and see her sooner.

## 2024-05-27 ENCOUNTER — Encounter: Payer: Self-pay | Admitting: Family Medicine

## 2024-05-27 ENCOUNTER — Ambulatory Visit: Admitting: Family Medicine

## 2024-05-27 ENCOUNTER — Telehealth: Payer: Self-pay

## 2024-05-27 VITALS — BP 124/78 | HR 100 | Temp 98.0°F | Resp 16 | Ht 67.0 in | Wt 263.4 lb

## 2024-05-27 DIAGNOSIS — R0789 Other chest pain: Secondary | ICD-10-CM

## 2024-05-27 NOTE — Progress Notes (Signed)
 Chief Complaint  Patient presents with   Follow-up    ER Follow Up    Michelle Hancock is a 23 y.o. female here for evaluation of chest pain.  Duration of issue: 8 days Quality: achy Palliation: none Provocation: certain positions, deep breaths Not exertional or related to meals. Severity: 9/10 Radiation: to back Duration of chest pain: 4 d Associated symptoms: SOB, feeling flushed Cardiac history: none ER workup on 11/18 was neg.  Family heart history: no 1st degree relatives w heart disease Smoker? 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   PCOS (polycystic ovarian syndrome)    Family History  Problem Relation Age of Onset   Allergies Father    Migraines Father    Allergic rhinitis Father    Gestational diabetes Mother    Hypertension Mother    Hyperlipidemia Mother    Breast cancer Maternal Aunt    Heart attack Maternal Grandmother     BP 124/78 (BP Location: Left Arm, Patient Position: Sitting)   Pulse 100   Temp 98 F (36.7 C) (Oral)   Resp 16   Ht 5' 7 (1.702 m)   Wt 263 lb 6.4 oz (119.5 kg)   LMP 05/07/2024   SpO2 99%   BMI 41.25 kg/m  Gen: awake, alert, appears stated age HEENT: MMM Neck: No masses or asymmetry Heart: RRR, no bruits, no LE edema Lungs: CTAB, no accessory muscle use Abd: Soft, NT, ND, no masses or organomegaly MSK: chest pain is somewhat reproducible to palptation over the central chest Psych: Age appropriate judgment and insight, nml mood and affect  Chest wall pain  ER workup was reassuring.  Given triggers, suspect pleurisy versus musculoskeletal etiology.  Stretches and exercises provided.  Heat, ice, Tylenol .  It is improving steadily overall.  If she wishes to see a heart doctor for reassurance, she will let us  know. F/u prn. The patient voiced  understanding and agreement to the plan.  Mabel Mt Avera, DO 05/27/24 12:40 PM

## 2024-05-27 NOTE — Patient Instructions (Addendum)
Ice/cold pack over area for 10-15 min twice daily.  Heat (pad or rice pillow in microwave) over affected area, 10-15 minutes twice daily.   OK to take Tylenol 1000 mg (2 extra strength tabs) or 975 mg (3 regular strength tabs) every 6 hours as needed.  Ibuprofen 400-600 mg (2-3 over the counter strength tabs) every 6 hours as needed for pain.  Let us know if you need anything.  Pectoralis Major Rehab Ask your health care provider which exercises are safe for you. Do exercises exactly as told by your health care provider and adjust them as directed. It is normal to feel mild stretching, pulling, tightness, or discomfort as you do these exercises, but you should stop right away if you feel sudden pain or your pain gets worse. Do not begin these exercises until told by your health care provider. Stretching and range of motion exercises These exercises warm up your muscles and joints and improve the movement and flexibility of your shoulder. These exercises can also help to relieve pain, numbness, and tingling. Exercise A: Pendulum  Stand near a wall or a surface that you can hold onto for balance. Bend at the waist and let your left / right arm hang straight down. Use your other arm to keep your balance. Relax your arm and shoulder muscles, and move your hips and your trunk so your left / right arm swings freely. Your arm should swing because of the motion of your body, not because you are using your arm or shoulder muscles. Keep moving so your arm swings in the following directions, as told by your health care provider: Side to side. Forward and backward. In clockwise and counterclockwise circles. Slowly return to the starting position. Repeat 2 times. Complete this exercise 3 times per week. Exercise B: Abduction, standing Stand and hold a broomstick, a cane, or a similar object. Place your hands a little more than shoulder-width apart on the object. Your left / right hand should be palm-up,  and your other hand should be palm-down. While keeping your elbow straight and your shoulder muscles relaxed, push the stick across your body toward your left / right side. Raise your left / right arm to the side of your body and then over your head until you feel a stretch in your shoulder. Stop when you reach the angle that is recommended by your health care provider. Avoid shrugging your shoulder while you raise your arm. Keep your shoulder blade tucked down toward the middle of your spine. Hold for 10 seconds. Slowly return to the starting position. Repeat 2 times. Complete this exercise 3 times per week. Exercise C: Wand flexion, supine  Lie on your back. You may bend your knees for comfort. Hold a broomstick, a cane, or a similar object so that your hands are about shoulder-width apart on the object. Your palms should face toward your feet. Raise your left / right arm in front of your face, then behind your head (toward the floor). Use your other hand to help you do this. Stop when you feel a gentle stretch in your shoulder, or when you reach the angle that is recommended by your health care provider. Hold for 3 seconds. Use the broomstick and your other arm to help you return your left / right arm to the starting position. Repeat 2 times. Complete this exercise 3 times per week. Exercise D: Wand shoulder external rotation Stand and hold a broomstick, a cane, or a similar object so your  hands are about shoulder-width apart on the object. Start with your arms hanging down, then bend both elbows to an "L" shape (90 degrees). Keep your left / right elbow at your side. Use your other hand to push the stick so your left / right forearm moves away from your body, out to your side. Keep your left / right elbow bent to 90 degrees and keep it against your side. Stop when you feel a gentle stretch in your shoulder, or when you reach the angle recommended by your health care provider. Hold for 10  seconds. Use the stick to help you return your left / right arm to the starting position. Repeat 2 times. Complete this exercise 3 times per week. Strengthening exercises These exercises build strength and endurance in your shoulder. Endurance is the ability to use your muscles for a long time, even after your muscles get tired. Exercise E: Scapular protraction, standing Stand so you are facing a wall. Place your feet about one arm-length away from the wall. Place your hands on the wall and straighten your elbows. Keep your hands on the wall as you push your upper back away from the wall. You should feel your shoulder blades sliding forward. Keep your elbows and your head still. If you are not sure that you are doing this exercise correctly, ask your health care provider for more instructions. Hold for 3 seconds. Slowly return to the starting position. Let your muscles relax completely before you repeat this exercise. Repeat 2 times. Complete this exercise 3 times per week. Exercise F: Shoulder blade squeezes  (scapular retraction) Sit with good posture in a stable chair. Do not let your back touch the back of the chair. Your arms should be at your sides with your elbows bent. You may rest your forearms on a pillow if that is more comfortable. Squeeze your shoulder blades together. Bring them down and back. Keep your shoulders level. Do not lift your shoulders up toward your ears. Hold for 3 seconds. Return to the starting position. Repeat 2 times. Complete this exercise 3 times per week. This information is not intended to replace advice given to you by your health care provider. Make sure you discuss any questions you have with your health care provider. Document Released: 06/20/2005 Document Revised: 03/31/2016 Document Reviewed: 03/08/2015 Elsevier Interactive Patient Education  [employer].

## 2024-05-27 NOTE — Telephone Encounter (Signed)
 Called pt to get more information about the long term heart monitor, pt stated she don't complete the 7-14 day time frame for [employer] heart monitor. Advised pt I would let Dr.Wendling know and it couldn't give result because it was completed.

## 2024-06-03 ENCOUNTER — Inpatient Hospital Stay: Admitting: Family Medicine

## 2024-07-19 ENCOUNTER — Telehealth: Admitting: Family Medicine

## 2024-07-19 ENCOUNTER — Encounter: Payer: Self-pay | Admitting: Family Medicine

## 2024-07-19 DIAGNOSIS — F419 Anxiety disorder, unspecified: Secondary | ICD-10-CM

## 2024-07-19 NOTE — Progress Notes (Signed)
 Chief Complaint  Patient presents with   Follow-up    Follow Up    Subjective: Patient is a 24 y.o. female here for ESA discussion. We are interacting via web portal for an electronic face-to-face visit. I verified patient's ID using 2 identifiers. Patient agreed to proceed with visit via this method. Patient is at home, I am at office. Patient and I are present for visit.   Pt moved a couple weeks and has limited social support. She has 2 cats who help calm her down. She is having some anxiety and depressive symptoms. She is not seeing a therapist/counselor now. Denies HI or SI. No self medication. She is not on medication and does not desire it.   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 She politely declines medication. Counseled on exercise. Fu as originally scheduled.  The patient voiced understanding and agreement to the plan.  Mabel Mt Braggs, DO 07/19/24  2:43 PM

## 2024-07-22 ENCOUNTER — Encounter: Payer: Self-pay | Admitting: Family Medicine

## 2024-07-26 NOTE — Telephone Encounter (Signed)
 Zegarra, Laila contact pt about insurance.

## 2024-08-23 ENCOUNTER — Ambulatory Visit: Admitting: Neurology
